# Patient Record
Sex: Female | Born: 1945 | Race: White | Hispanic: No | Marital: Married
Health system: Southern US, Community
[De-identification: ages and names within clinical notes are randomized; demographics above are authoritative.]

---

## 2004-09-15 ENCOUNTER — Ambulatory Visit: Payer: Self-pay | Admitting: Unknown Physician Specialty

## 2004-09-28 ENCOUNTER — Ambulatory Visit: Payer: Self-pay | Admitting: Unknown Physician Specialty

## 2005-05-05 ENCOUNTER — Ambulatory Visit: Payer: Self-pay | Admitting: Surgery

## 2005-09-21 ENCOUNTER — Ambulatory Visit: Payer: Self-pay | Admitting: Internal Medicine

## 2005-09-27 ENCOUNTER — Ambulatory Visit: Payer: Self-pay | Admitting: Urology

## 2005-12-15 ENCOUNTER — Ambulatory Visit: Payer: Self-pay | Admitting: Urology

## 2006-02-01 ENCOUNTER — Ambulatory Visit: Payer: Self-pay | Admitting: Internal Medicine

## 2006-02-16 ENCOUNTER — Ambulatory Visit (HOSPITAL_COMMUNITY): Admission: RE | Admit: 2006-02-16 | Discharge: 2006-02-17 | Payer: Self-pay | Admitting: Neurosurgery

## 2006-09-27 ENCOUNTER — Ambulatory Visit: Payer: Self-pay | Admitting: Internal Medicine

## 2012-05-22 LAB — URINALYSIS, COMPLETE
Bacteria: NONE SEEN
Ketone: NEGATIVE
Nitrite: NEGATIVE
RBC,UR: 6 /HPF (ref 0–5)
Squamous Epithelial: NONE SEEN
WBC UR: 2 /HPF (ref 0–5)

## 2012-05-22 LAB — COMPREHENSIVE METABOLIC PANEL
Alkaline Phosphatase: 106 U/L (ref 50–136)
BUN: 37 mg/dL — ABNORMAL HIGH (ref 7–18)
Bilirubin,Total: 0.9 mg/dL (ref 0.2–1.0)
Chloride: 100 mmol/L (ref 98–107)
EGFR (African American): 29 — ABNORMAL LOW
EGFR (Non-African Amer.): 25 — ABNORMAL LOW
Osmolality: 290 (ref 275–301)
Potassium: 2.9 mmol/L — ABNORMAL LOW (ref 3.5–5.1)

## 2012-05-22 LAB — SALICYLATE LEVEL: Salicylates, Serum: <1.7 mg/dL

## 2012-05-22 LAB — CBC
HCT: 45.2 % (ref 35.0–47.0)
HGB: 15.3 g/dL (ref 12.0–16.0)
MCHC: 33.8 g/dL (ref 32.0–36.0)
Platelet: 303 10*3/uL (ref 150–440)

## 2012-05-22 LAB — ETHANOL
Ethanol %: <0.003 % (ref 0.000–0.080)
Ethanol: <3 mg/dL

## 2012-05-22 LAB — DRUG SCREEN, URINE
Barbiturates, Ur Screen: NEGATIVE (ref ?–200)
Methadone, Ur Screen: NEGATIVE (ref ?–300)
Opiate, Ur Screen: POSITIVE (ref ?–300)
Tricyclic, Ur Screen: POSITIVE (ref ?–1000)

## 2012-05-22 LAB — ACETAMINOPHEN LEVEL: Acetaminophen: 2 ug/mL — ABNORMAL LOW

## 2012-05-22 LAB — TROPONIN I: Troponin-I: <0.02 ng/mL

## 2012-05-23 ENCOUNTER — Observation Stay: Payer: Self-pay | Admitting: Internal Medicine

## 2012-05-23 LAB — CBC WITH DIFFERENTIAL/PLATELET
Basophil #: 0 10*3/uL (ref 0.0–0.1)
Basophil %: 0.1 %
Lymphocyte #: 1.2 10*3/uL (ref 1.0–3.6)
Lymphocyte %: 8.6 %
MCH: 31.2 pg (ref 26.0–34.0)
Neutrophil #: 12 10*3/uL — ABNORMAL HIGH (ref 1.4–6.5)
Neutrophil %: 85.7 %
Platelet: 258 10*3/uL (ref 150–440)

## 2012-05-23 LAB — BASIC METABOLIC PANEL
Anion Gap: 5 — ABNORMAL LOW (ref 7–16)
Chloride: 98 mmol/L (ref 98–107)
Co2: 31 mmol/L (ref 21–32)
Creatinine: 1.36 mg/dL — ABNORMAL HIGH (ref 0.60–1.30)
EGFR (Non-African Amer.): 40 — ABNORMAL LOW
Glucose: 353 mg/dL — ABNORMAL HIGH (ref 65–99)
Osmolality: 290 (ref 275–301)
Potassium: 3.8 mmol/L (ref 3.5–5.1)
Sodium: 134 mmol/L — ABNORMAL LOW (ref 136–145)

## 2012-05-24 ENCOUNTER — Inpatient Hospital Stay: Payer: Self-pay | Admitting: Psychiatry

## 2012-05-24 LAB — BASIC METABOLIC PANEL
Chloride: 100 mmol/L (ref 98–107)
EGFR (African American): >60
Glucose: 143 mg/dL — ABNORMAL HIGH (ref 65–99)
Potassium: 3.1 mmol/L — ABNORMAL LOW (ref 3.5–5.1)

## 2012-05-25 LAB — BEHAVIORAL MEDICINE 1 PANEL
Anion Gap: 5 — ABNORMAL LOW (ref 7–16)
Basophil #: 0.1 10*3/uL (ref 0.0–0.1)
Basophil %: 0.5 %
Chloride: 103 mmol/L (ref 98–107)
Co2: 29 mmol/L (ref 21–32)
Creatinine: 0.77 mg/dL (ref 0.60–1.30)
EGFR (African American): >60
EGFR (Non-African Amer.): >60
Eosinophil #: 0 10*3/uL (ref 0.0–0.7)
Eosinophil %: 0.2 %
Glucose: 114 mg/dL — ABNORMAL HIGH (ref 65–99)
HCT: 33.5 % — ABNORMAL LOW (ref 35.0–47.0)
MCHC: 35.5 g/dL (ref 32.0–36.0)
Monocyte #: 0.8 x10 3/mm (ref 0.2–0.9)
Neutrophil %: 82 %
Potassium: 2.9 mmol/L — ABNORMAL LOW (ref 3.5–5.1)
RBC: 3.82 10*6/uL (ref 3.80–5.20)
SGPT (ALT): 44 U/L (ref 12–78)
Thyroid Stimulating Horm: 2.96 u[IU]/mL
Total Protein: 6.3 g/dL — ABNORMAL LOW (ref 6.4–8.2)
WBC: 11 10*3/uL (ref 3.6–11.0)

## 2012-05-25 LAB — URINALYSIS, COMPLETE
Glucose,UR: NEGATIVE mg/dL (ref 0–75)
Leukocyte Esterase: NEGATIVE
Nitrite: NEGATIVE
Ph: 8 (ref 4.5–8.0)
Protein: 30
RBC,UR: 18 /HPF (ref 0–5)
WBC UR: 1 /HPF (ref 0–5)

## 2012-05-26 LAB — BASIC METABOLIC PANEL
BUN: 15 mg/dL (ref 7–18)
Calcium, Total: 8.3 mg/dL — ABNORMAL LOW (ref 8.5–10.1)
Chloride: 102 mmol/L (ref 98–107)
Co2: 28 mmol/L (ref 21–32)
Creatinine: 0.88 mg/dL (ref 0.60–1.30)
Potassium: 3.1 mmol/L — ABNORMAL LOW (ref 3.5–5.1)
Sodium: 137 mmol/L (ref 136–145)

## 2012-05-27 LAB — BASIC METABOLIC PANEL
Anion Gap: 8 (ref 7–16)
Chloride: 101 mmol/L (ref 98–107)
Co2: 27 mmol/L (ref 21–32)
EGFR (African American): >60
Glucose: 120 mg/dL — ABNORMAL HIGH (ref 65–99)
Potassium: 3 mmol/L — ABNORMAL LOW (ref 3.5–5.1)
Sodium: 136 mmol/L (ref 136–145)

## 2012-05-29 LAB — BASIC METABOLIC PANEL
Anion Gap: 8 (ref 7–16)
BUN: 14 mg/dL (ref 7–18)
Calcium, Total: 8.3 mg/dL — ABNORMAL LOW (ref 8.5–10.1)
Chloride: 106 mmol/L (ref 98–107)
EGFR (African American): >60
Glucose: 93 mg/dL (ref 65–99)
Osmolality: 280 (ref 275–301)
Sodium: 140 mmol/L (ref 136–145)

## 2013-09-07 IMAGING — CT CT HEAD WITHOUT CONTRAST
1 of 2 series · 16 of 30 positions shown, 20 images · non-contrast
Comparison: none

REASON FOR EXAM: ams
COMMENTS:

PROCEDURE:     CT  - CT HEAD WITHOUT CONTRAST  - May 22, 2012  [DATE]
RESULT:     Comparison:  None
TECHNIQUE: Multiple axial images from the foramen magnum to the vertex were
obtained without IV contrast.

[Series 2: soft tissue · axial · 0.41mm/px · z∈[+417,+552]mm · 16 of 31 slices shown, 20 images]
[im 2/31  brain]
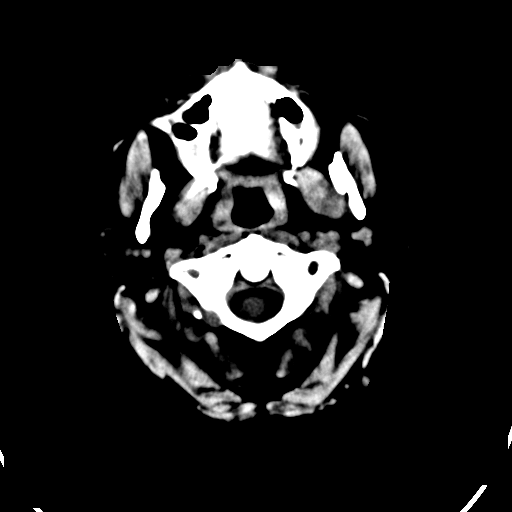
[im 2/31  bone]
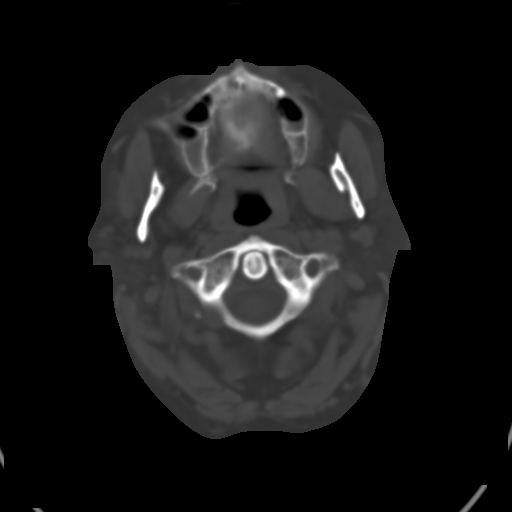
[im 3/31  brain]
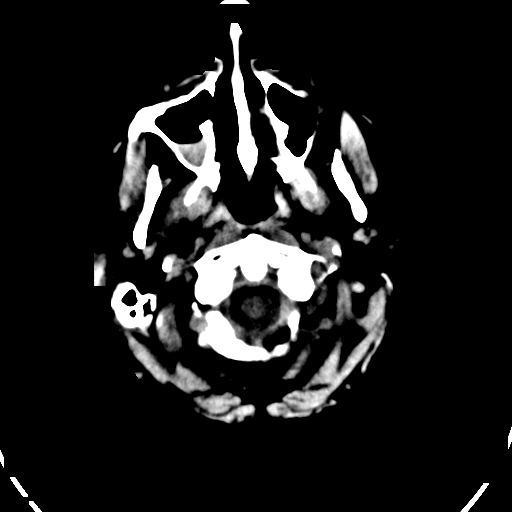
[im 6/31  brain]
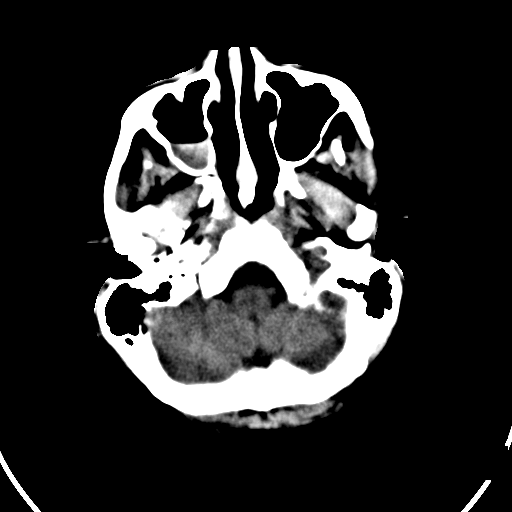
[im 8/31  brain]
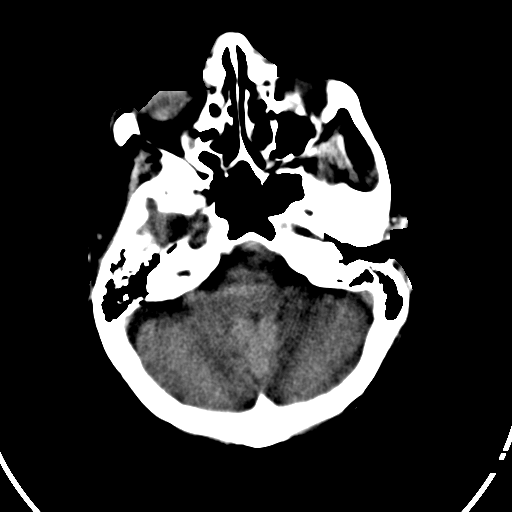
[im 9/31  brain]
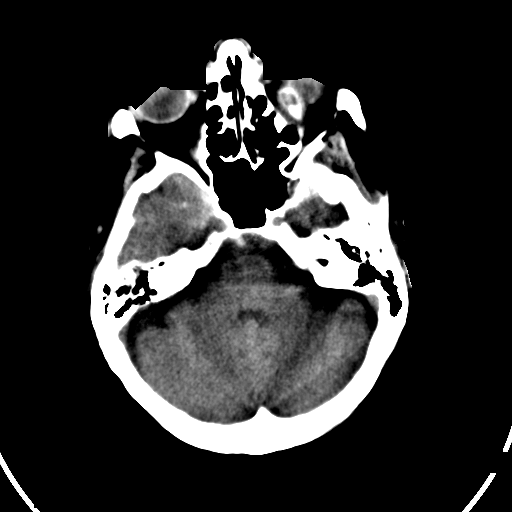
[im 9/31  bone]
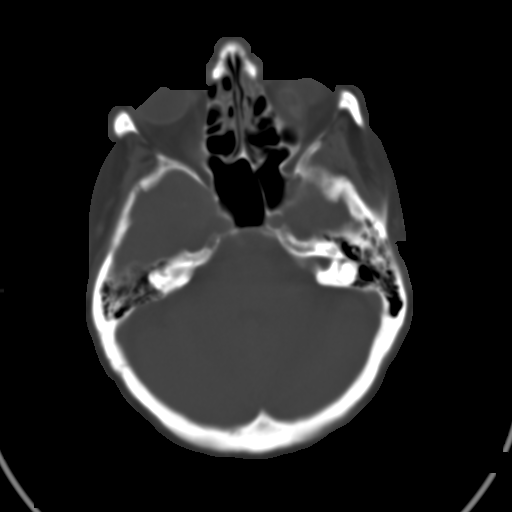
[im 11/31  brain]
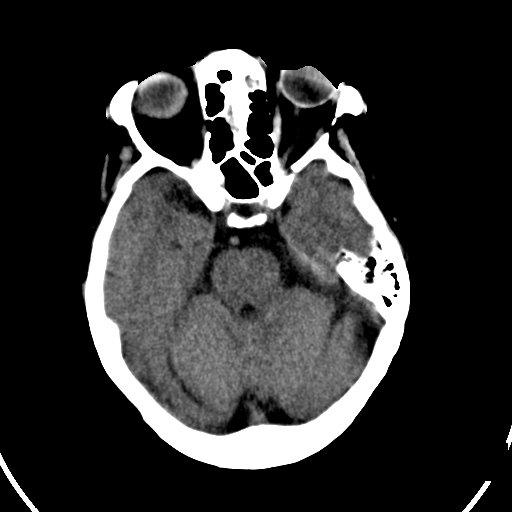
[im 13/31  brain]
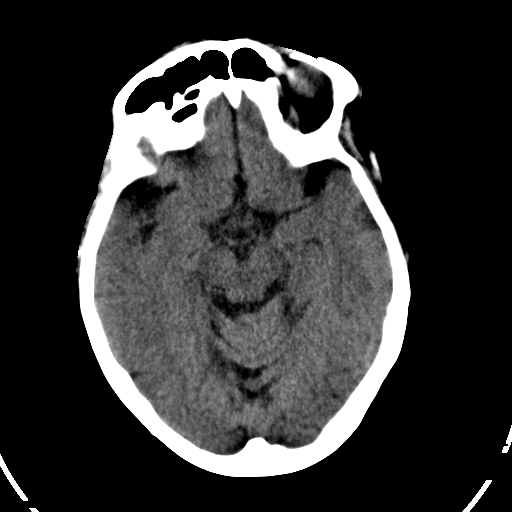
[im 15/31  brain]
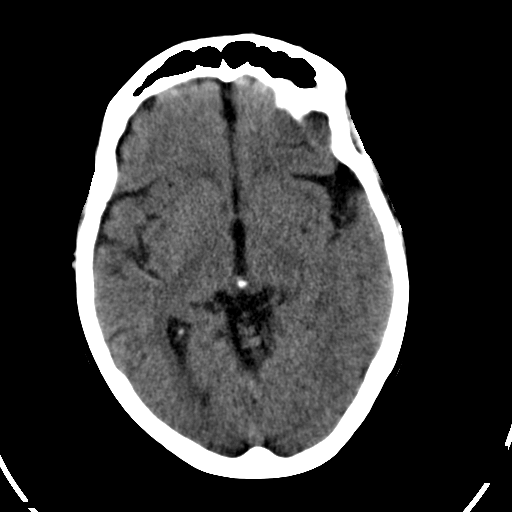
[im 16/31  brain]
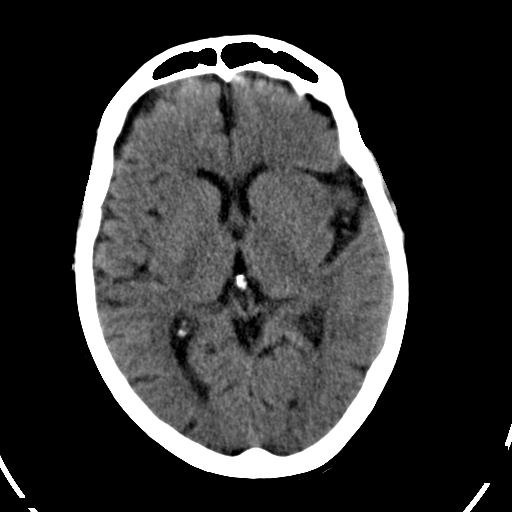
[im 16/31  bone]
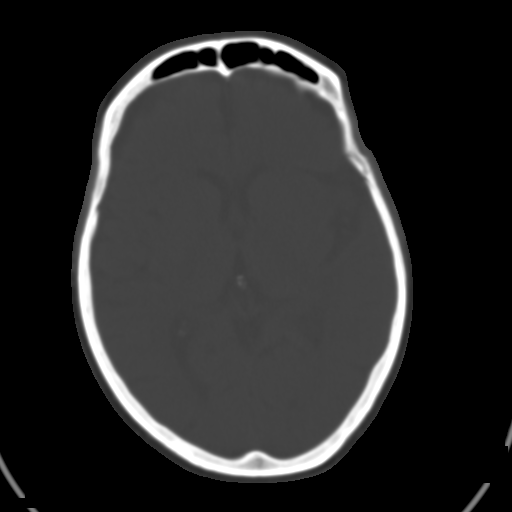
[im 18/31  brain]
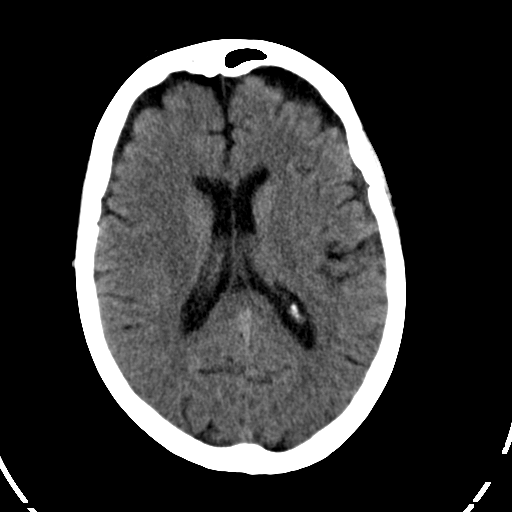
[im 21/31  brain]
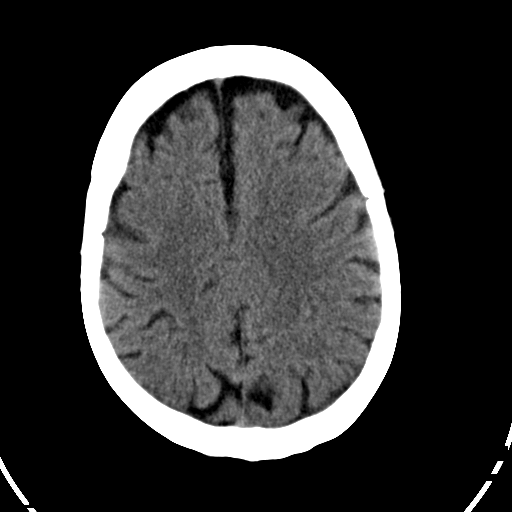
[im 22/31  brain]
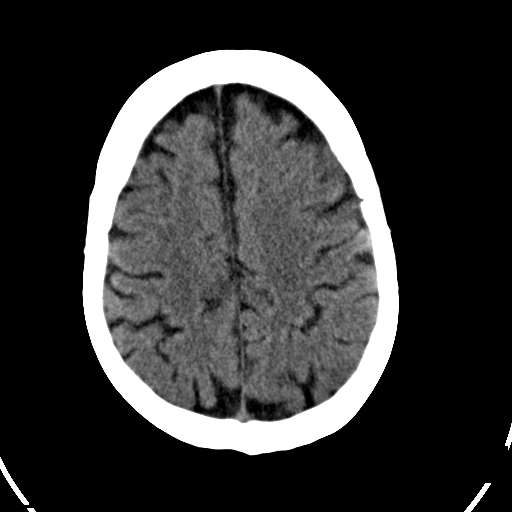
[im 23/31  brain]
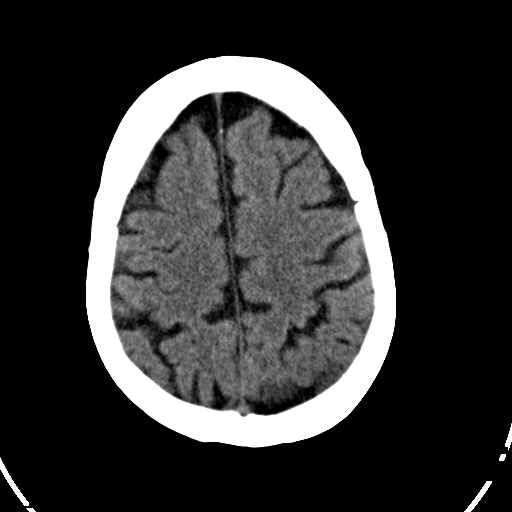
[im 23/31  bone]
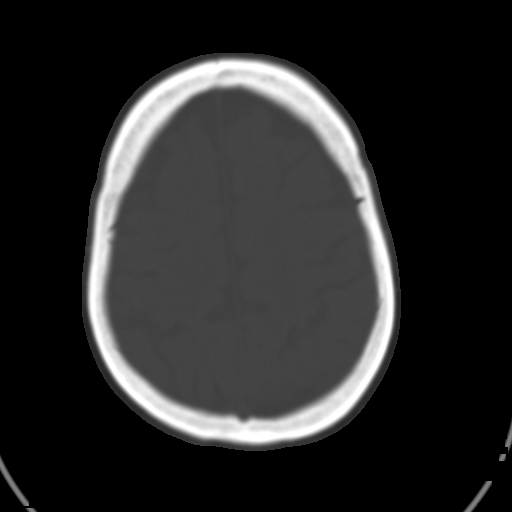
[im 25/31  brain]
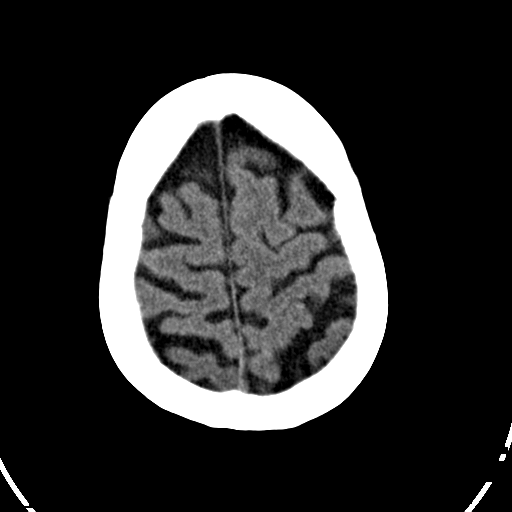
[im 28/31  brain]
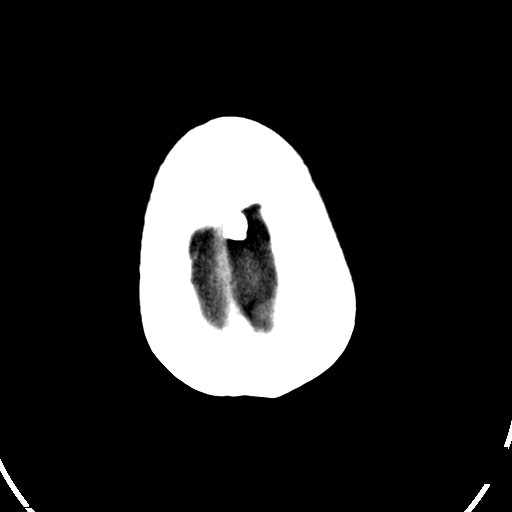
[im 29/31  brain]
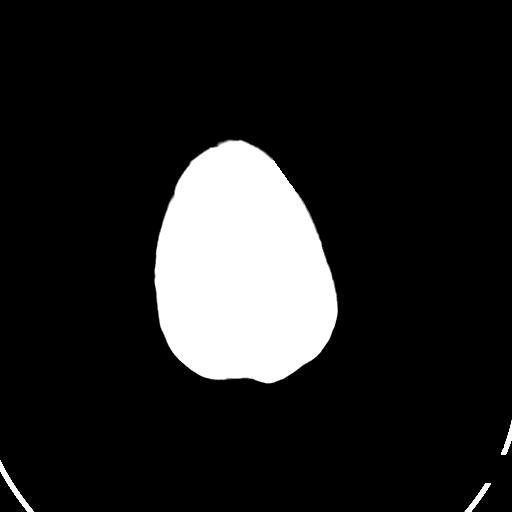

[16 of 30 positions shown; findings below may reference images not displayed]

FINDINGS: There is no evidence of mass effect, midline shift, or extra-axial fluid
collections.  There is no evidence of a space-occupying lesion or
intracranial hemorrhage. There is no evidence of a cortical-based area of
acute infarction. There is generalized cerebral atrophy. There is
periventricular white matter low attenuation likely secondary to
microangiopathy.

The ventricles and sulci are appropriate for the patient's age. The basal
cisterns are patent.

Visualized portions of the orbits are unremarkable. There is a right
maxillary sinus air-fluid level.

The osseous structures are unremarkable.
IMPRESSION: No acute intracranial process.

[REDACTED]

## 2014-02-07 DIAGNOSIS — Z961 Presence of intraocular lens: Secondary | ICD-10-CM | POA: Diagnosis not present

## 2014-02-07 DIAGNOSIS — H4052X1 Glaucoma secondary to other eye disorders, left eye, mild stage: Secondary | ICD-10-CM | POA: Diagnosis not present

## 2014-02-07 DIAGNOSIS — Z9842 Cataract extraction status, left eye: Secondary | ICD-10-CM | POA: Diagnosis not present

## 2014-02-07 DIAGNOSIS — H3581 Retinal edema: Secondary | ICD-10-CM | POA: Diagnosis not present

## 2014-05-03 NOTE — Consult Note (Signed)
Brief Consult Note: Diagnosis: major depression.   Patient was seen by consultant.   Recommend further assessment or treatment.   Discussed with Attending MD.   Comments: Psychiatry: Patient seen. Admits to symptoms of major depression. Under IVC. Will transfer to [facility]. Will notify intake nurse.  Electronic Signatures: Clapacs, Madie Reno (MD)  (Signed 14-May-14 15:33)  Authored: Brief Consult Note   Last Updated: 14-May-14 15:33 by Gonzella Lex (MD)

## 2014-05-03 NOTE — H&P (Signed)
PATIENT NAME:  Becky Rodriguez, Becky Rodriguez MR#:  [id number] DATE OF BIRTH:  11-10-1945  DATE OF ADMISSION:  05/24/2012  IDENTIFYING INFORMATION AND CHIEF COMPLAINT:  This is a 69 year old woman who was admitted through the Emergency Room for having taken an overdose of Xanax and Percocet.   CHIEF COMPLAINT:  "It's just those kids of mine."   HISTORY OF PRESENT ILLNESS:  The patient was admitted on May 12 through the Emergency Room where she presented after being found with altered mental status by her husband.  The patient reported that she had taken an overdose of Xanax and Percocet.  Initially she had stated that she was taking the Xanax to sleep, but on closer questioning admitted that she had taken 4 or 5 of each pill and that she had wanted to go to sleep and not wake up.  She said that it was impulsive, but that she has been having increasing thoughts about suicide recently.  Her mood has been depressed and down for many months, maybe over a year.  She feels tired most of the time.  She does not enjoy her usual activities.  Her husband says that she no longer gardens or gets out of the house.  She has had poor and interrupted sleep at night, especially when she is not taking medication for sleep.  She denies any hallucinations or psychotic symptoms.  She tends to blame her bad mood on the fact that her adult children ask her for money and take advantage of her.  Both she and her husband complain of this, but she complains of quite a bitterly.  She says that two of her adult children are constantly coming around looking for handouts and she thinks that they are irresponsible and she gets angry at them, but has not been able to say no to them.   PAST PSYCHIATRIC HISTORY:  No history of psychiatric hospitalizations.  She has seen her primary care doctor in [facility] who has prescribed citalopram 20 mg a day and BuSpar, unknown dose, possibly 10 mg twice a day as well as amitriptyline 25 mg at night.  The patient  said that she had been taking these medicines, but when I called her pharmacy they were last filled in February so she has not been compliant regularly with them.  She denies any history of suicide attempts.  Denies any history of psychotic symptoms.  Denies any history of mania.   SUBSTANCE ABUSE HISTORY:  The patient says she drinks very rarely and denies any history of alcohol or drug abuse.   SOCIAL HISTORY:  The patient is retired.  Lives with her husband.  Between the two of them they have four adult children, two of whom are her biological children.  There are at least two of them that she has a lot of irritation towards.   PAST MEDICAL HISTORY:  The patient does not have much insight about her medical history.  She says that she does not have any significant ongoing medical problems.  On the other hand, she has been prescribed chlorthalidone by her doctor in [place].  It is not clear why she has been on this, whether it is for her blood pressure or what, but it may be the reason why she was running a low potassium here in the hospital.  Denies history of strokes.  Denies a history of heart attacks.   CURRENT MEDICATIONS:  Apparently, chlorthalidone 1 mg once or twice a day, BuSpar unknown dose, citalopram  20 mg a day, amitriptyline 25 to 50 at night, unclear how compliant she has been with any of this.  Neither the Xanax nor the Percocet are her only medicine.   ALLERGIES:  No known drug allergies.   REVIEW OF SYSTEMS:  Positive for depression, low energy, lack of interest in normal activities.  Has had passive suicidal thoughts, then had an active suicidal thought.  Denies hallucinations or delusions.  Reports weakness.  Denies chest pain.  Denies shortness of breath or other physical symptoms.   MENTAL STATUS EXAMINATION:  The patient was cooperative with the interview.  Eye contact was good.  Psychomotor activity appropriate.  Speech easy to understand.  Affect irritated and blunted.   Mood stated as being depressed.  Thoughts appear to be lucid without any clear loosening of associations or delusions.  Denies auditory or visual hallucinations.  Denies any current suicidal ideation or homicidal ideation.  Is alert and oriented x 4.   PHYSICAL EXAMINATION: GENERAL:  Normally built woman, looks her stated age.  Cooperative with the exam.  HEENT:  Pupils are equal and reactive.  (Dictation Anomaly) Face is symetric.  She has a notable rash and roughening on both of her cheeks, although she does not report being aware of it.  No other skin lesions identified.  Face is symmetric.  MUSCULOSKELETAL:  Full range of motion at neck.  Full range of motion at all extremities.  Normal gait.  Strength and reflexes normal and symmetric throughout.  Cranial nerves symmetric.  LUNGS:  Clear without wheezes.  HEART:  Regular rate and rhythm.  ABDOMEN:  Soft, nontender, normal bowel sounds.   LABORATORY RESULTS:  Chemistry panel showed an elevated glucose on a nonfasting draw at 143.  Potassium on admission was very low at 2.9, had come up to 3.1 by the time of release.  CBC slightly high at 11.4.  Drug screen positive for tricyclics, probably the amitriptyline if she takes that.  Also positive for opiates and benzodiazepines from the overdose.  Borderline urinalysis.  Head CT unremarkable.   ASSESSMENT:  This is a 69 year old woman with multiple symptoms consistent with her recent major depression.  Took an intentional overdose of medicine.  She admits that there was some suicidal ideation to it.  She is not argumentative about coming to psychiatry.  She has been seeing her primary care doctor, but not following up regularly with her medicine.  Needs hospitalization for stabilization and treatment.   TREATMENT PLAN:  The patient was put under involuntary commitment in the Emergency Room.  She is however cooperative.  She will be transferred to behavioral health.  Continue current medications including  the potassium supplements started on medicine.  Also continue the citalopram.  Engage her in groups and activities on the unit.  Work on assessing degree of depression.  Work on referral for outpatient treatment ultimately.   DIAGNOSIS, PRINCIPAL AND PRIMARY:  AXIS I:  Major depression, single, severe.   SECONDARY DIAGNOSES: AXIS I:  No further.  AXIS II:  Deferred.  AXIS III:  Hypokalemia, possibly drug-induced, status post overdose, recovering.  AXIS IV:  Moderate from the stress she describes at home.  AXIS V:  Functioning at time of evaluation 60.     ____________________________ Gonzella Lex, MD jtc:ea D: 05/24/2012 23:00:32 ET T: 05/24/2012 23:23:00 ET JOB#: [id number]  cc: Gonzella Lex, MD, <Dictator> Gonzella Lex MD ELECTRONICALLY SIGNED 05/25/2012 14:24

## 2014-05-03 NOTE — Discharge Summary (Signed)
PATIENT NAME:  Becky Rodriguez, Becky Rodriguez MR#:  [id number] DATE OF BIRTH:  1945-01-21  DATE OF ADMISSION:  05/24/2012 DATE OF DISCHARGE:  05/29/2012  HOSPITAL COURSE: See dictated history and physical for details of admission. This 69 year old woman was transferred from the medicine service, where she had been admitted for what appeared to be an intentional overdose of Percocet and benzodiazepines. On the psychiatry service, the patient was monitored for, but did not show any signs of withdrawal from drugs. She was engaged in groups and activities for monitoring of suicidal ideation and treatment of mood symptoms. The patient showed improvement in her insight and her mood symptoms throughout her hospital stay. She was tolerant of continuing BuSpar and low-dose Remeron. Citalopram had been continued initially, but the patient continued to have symptoms of diarrhea multiple times a day, which was probably contributing to her low potassium. Citalopram was discontinued, and the diarrhea seems to have lessened somewhat. At this point, I have not replaced it with another new antidepressant. Her mood and affect appear to be improved, and she is showing better insight. She has been referred for outpatient treatment at [facility] for followup evaluation of substance use and mood disorder. She has been given individual and group therapy focusing on improving her self-confidence and ability to set boundaries at home.   DISCHARGE MEDICATION: Mirtazapine 15 mg at night, BuSpar 10 mg 3 times a day, potassium chloride 20 mEq twice a day.   LABORATORY RESULTS: Most recent chemistry panel shows potassium up to 4.1, everything else normal. At the earlier time in her hospital stay, she had an initial drug screen positive for opiates and benzodiazepines. Urinalysis not infected. Head CT normal. CBC with a slightly elevated white count. Chemistry panel showed a creatinine elevated at 1.36, sodium low at 134, potassium initially 3.8, but then  went down first to 3.1 and later to 3. Low was 2.9. Potassium has been supplemented as noted above.   MENTAL STATUS EXAMINATION AT DISCHARGE: A neatly dressed and groomed woman, looks her stated age. Cooperative with the interview. Good eye contact, normal psychomotor activity. Speech normal in rate, tone and volume. Affect euthymic, reactive and appropriate. Mood stated as good. Thoughts lucid. No loosening of associations or delusions. Denies auditory or visual hallucinations. Denies suicidal or homicidal ideation. Shows improved judgment and insight. Intelligence normal. Alert and oriented x4.   DISPOSITION: Discharge home to her husband. Follow up with [facility].   DIAGNOSIS, PRINCIPAL AND PRIMARY:  AXIS I: Depression, not otherwise specified.   SECONDARY DIAGNOSES:  AXIS I:  1. Opiate abuse versus dependence.  2. 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  AXIS II: Deferred.  AXIS III: Hypokalemia of unclear etiology.  AXIS IV: Severe from financial problems and conflict with her children.  AXIS V: Functioning at time of discharge 42.    ____________________________ Gonzella Lex, MD jtc:OSi D: 05/30/2012 00:06:00 ET T: 05/30/2012 07:35:37 ET JOB#: [id number]  cc: Gonzella Lex, MD, <Dictator> Gonzella Lex MD ELECTRONICALLY SIGNED 05/30/2012 9:37

## 2014-05-03 NOTE — Consult Note (Signed)
PATIENT NAME:  Becky Rodriguez, Becky Rodriguez MR#:  [id number] DATE OF BIRTH:  05-10-1945  DATE OF CONSULTATION:  05/23/2012  REFERRING PHYSICIAN:  Nicholes Mango, MD CONSULTING PHYSICIAN:  Cordelia Pen. Gretel Acre, MD  REASON FOR CONSULTATION: Drug overdose, anxiety.   HISTORY OF PRESENT ILLNESS: The patient is a 69 year old Caucasian married female who was admitted from the Emergency Room after she was found unresponsive by her husband. The patient was trying to fall asleep, but then she tried to take her husband's Xanax and gradually took 4 pills as well as 4 pills of Percocet. Eventually the patient was found to be cyanotic, as she took an overdose of the medications. EMS was called by her husband, who gave her Narcan. Following this, the patient became responsive. The patient was still very lethargic as she was brought into the ER. CT of the head was negative. The patient was given IV fluids and she started having shallow breathing. The patient was admitted by the hospitalist team. During my evaluation, the patient was noted to be calm. She reported that she was getting the pills from "her friends." She reported that she was upset with her children and she was having a lot of stressors. She does not want to get into details. She reported that she takes medications including Klonopin, one white pill, and then some chlorthalidone bottles were also found empty besides her. She  does not want to give out the details about the medications and where they were coming from. Reported that she has several stressors with her children and she was trying to minimize the events which led to her overdose on the medications. Reported that she will go back to her primary care physician, Dr. Marnee Guarneri in [facility], and they will start prescribing her medications for her anxiety.   Her husband, who was also present during the interview, reported that he was concerned about her use of the medications which she was getting from her friends. The patient  stated that she has a long history of anxiety and depression but she has never received any treatment, but now she thinks that she might need some treatment for the same.   PAST PSYCHIATRIC HISTORY: The patient reported that she has never been prescribed the medications, but she has always used them from the friends. She denied any previous history of suicide attempt.   MEDICAL HISTORY: Hypertension, obesity.   PAST SURGICAL HISTORY: Denied.   ALLERGIES: No known drug allergies.   CURRENT HOME MEDICATIONS: Denied any medication for hypertension, but has been taking Klonopin and BuSpar from the friends.   FAMILY HISTORY: Colon cancer in her family.   REVIEW OF SYSTEMS:  CONSTITUTIONAL: The patient denied any fever, fatigue or weakness.  EYES: Denied any blurry vision, inflammation or glaucoma.  EARS, NOSE, THROAT: Denied any epistaxis, discharge, postnasal drip or sinus pain. No difficulty in swallowing. RESPIRATORY: Denied any cough, chronic obstructive pulmonary disease or dyspnea.  CARDIOVASCULAR: No chest pain, palpitations, syncope.  GASTROINTESTINAL: No nausea, vomiting, diarrhea.  GENITOURINARY: No dysuria, hematuria, renal calculi, fever.  ENDOCRINE: No polyuria, nocturia, thyroid problems.  INTEGUMENTARY: No acne or rash.  MUSCULOSKELETAL: No joint pain or neck pain. Denies any gout.   VITAL SIGNS: Temperature 99.8, pulse 109, respirations 20, blood pressure 117/57.   LABORATORY DATA: Glucose 353, BUN 34, creatinine 1.36, sodium 134, potassium 3.8, chloride 98, bicarbonate 31, GFR 47, anion gap 5, osmolality 290, calcium 7.6, magnesium 1.6. Urine drug screen positive for benzodiazepines, opioids and tricyclic antidepressants. WBC  14, hemoglobin 12.9, hematocrit 36.6, MCV 89.   MENTAL STATUS EXAMINATION: The patient is a moderately built female who was lying in the bed. She appeared anxious. Her eye contact was good. Affect was blunted. TP- was tangential.TC- non delusional. She  was unable to contract for safety as she just OD on medications.  She was minimizing the events which led to her admission. She was not forthcoming with the information.   Diagnosis:  MOOD Do NOS  Plan: Discussed with her husband and the patient about the need for the patient being admitted to the inpatient behavioral health unit, and they were minimizing the events. I will initiate involuntary commitment for the safety of  the patient at this time.  Once she is clinically stable, she will the transferred to the behavioral health unit. She will not be given any medication at this time and her medication will be adjusted when she is clinically stable and transferred to [facility].   Thank you for allowing me to participate in the care of this patient.   ____________________________ Cordelia Pen. Gretel Acre, MD usf:jm D: 05/23/2012 17:35:54 ET T: 05/23/2012 18:30:37 ET JOB#: [id number]  cc: Cordelia Pen. Gretel Acre, MD, <Dictator> Jeronimo Norma MD ELECTRONICALLY SIGNED 05/25/2012 13:20

## 2014-05-03 NOTE — Discharge Summary (Signed)
PATIENT NAME:  Becky Rodriguez, Becky Rodriguez MR#:  [id number] DATE OF BIRTH:  [birth year]  DATE OF ADMISSION:  05/23/2012 DATE OF DISCHARGE:  05/24/2012   For a detailed note, please take a look at the history and history and physical done on admission.   DIAGNOSES AT DISCHARGE: Drug overdose secondary to Xanax and Percocet. Altered mental status secondary to a drug overdose: Depression with suicide attempt. Acute renal failure. Hypokalemia. Anxiety and depression.   DISPOSITION: The patient is being discharged to inpatient behavioral medicine.   ACTIVITY: As tolerated.   FOLLOWUP: With her primary care physician on discharge. That needs to be arranged, as she does not have one.   DISCHARGE MEDICATIONS: Potassium 20 mEq b.i.d. x 5 days.   Clever COURSE: Dr. Weber Cooks from psychiatry and Dr. Gretel Acre also from psychiatry.   PERTINENT STUDIES DONE DURING THE HOSPITAL COURSE: CT scan of the head done without contrast on admission showing no acute intracranial process.   HOSPITAL COURSE: This is a 69 year old female with medical problems as mentioned above who presented to the hospital on 05/23/2012 secondary to altered mental status from a drug overdose.  1.  Altered mental status. This was likely secondary to drug overdose. The patient took a combination of Percocet and Xanax and presented altered. She was given some Narcan in the ER  and significantly improved. She was admitted and observed overnight. A psychiatric consult was obtained. The patient apparently is not on those medications and took those medications from her husband. She showed signs of suicidal ideations and depression and therefore was involuntarily committed. The patient's mental status is back to baseline. Therefore, she is being discharged to inpatient behavioral medicine for further management of her depression.  2.  Acute renal failure. This was likely secondary to dehydration and poor p.o. intake. This resolved with  aggressive IV fluid hydration and her creatinine is now back down to baseline.  3.  Hypokalemia, also secondary to poor p.o. intake. Her potassium has improved with supplementation, but I am discharging her on 5 more days of p.o. potassium as mentioned.  4.  Depression. The patient currently denies suicidal ideations, although has been seen by psychiatry and has been involuntarily committed, and they are accepting her downstairs for further management of her depression.   The patient is stable from the medical standpoint to be discharged to behavioral medicine. This was discussed with Dr. Weber Cooks.   TIME SPENT ON DISCHARGE: 40 minutes.   ____________________________ Belia Heman. Verdell Carmine, MD vjs:jm D: 05/24/2012 15:56:21 ET T: 05/24/2012 17:27:29 ET JOB#: [id number]  cc: Belia Heman. Verdell Carmine, MD, <Dictator> Henreitta Leber MD ELECTRONICALLY SIGNED 05/30/2012 14:48

## 2014-05-03 NOTE — H&P (Signed)
PATIENT NAME:  Becky Rodriguez, Becky Rodriguez MR#:  [id number] DATE OF BIRTH:  July 17, 1945  DATE OF ADMISSION:  05/23/2012  PRIMARY CARE PHYSICIAN: Dr. Ramonita Lab   REFERRING PHYSICIAN: Dr. Owens Shark   CHIEF COMPLAINT: Drug overdose.   HISTORY OF PRESENT ILLNESS: The patient is a 69 year old Caucasian female with a past medical history of anxiety and hypertension that was sent over to the ER by her husband via EMS after she was found unresponsive. At around 4:00 p.m. today the patient was trying to fall asleep, but she could not. She tried her husband's Xanax 4 pills and Percocet 4 pills to get good sleep. Eventually the patient was found to be cyanotic and unresponsive by her husband, who called EMS immediately. EMS gave her Narcan one dose. Following this, patient became responsive. The patient was still lethargic and she was brought into the ER. CT scan of the head was negative. The patient was given IV fluids and she was having shallow breathing. She was hypoxemic and she was placed on 2 liters of oxygen. Hospitalist team is called to admit the patient for hypoxemia and overnight observation. During my examination, the patient is arousable although she is lethargic. No family members are at bedside. Answered a few questions but falling asleep. She says she is thirsty, but denies any other complaints. She admits that she takes Klonopin 5 mg on a daily basis for anxiety. The patient's medications BuSpar and chlorthalidone bottles were also found empty at the bedside. The patient answers most of the questions but lethargic and falling asleep.   PAST MEDICAL HISTORY: Long history of anxiety and hypertension.   PAST SURGICAL HISTORY: None.   ALLERGIES: No known drug allergies.   HOME MEDICATIONS:  1.  Klonopin.  2.  Denies any medications for hypertension.  3.  BuSpar    PSYCHOSOCIAL HISTORY: Lives with her husband. No history of smoking, alcohol or illicit drug use.   FAMILY HISTORY: Mother and dad both had colon  cancer.   REVIEW OF SYSTEMS:  CONSTITUTIONAL: Denies any fever, fatigue, weakness.  EYES: No blurry vision, inflammation, glaucoma.  EARS, NOSE, THROAT: No epistaxis, discharge, postnasal drip or sinus pain. No difficulty in swallowing.  RESPIRATION: No cough, chronic obstructive pulmonary disease or dyspnea.  CARDIOVASCULAR: No chest pain, palpitations, syncope.  GASTROINTESTINAL: No nausea, vomiting, diarrhea, abdominal pain. Denies any GERD.  GENITOURINARY: No dysuria, hematuria, renal calculi, fever.  GYNECOLOGY AND BREASTS: No breast masses or vaginal discharge.  ENDOCRINE: No polyuria, nocturia, thyroid problems.  LYMPHATIC: No anemia, easy bruising or bleeding.  INTEGUMENTARY: No acne, rash, lesions.  MUSCULOSKELETAL: No joint pain in the neck, back, shoulder. Denies any gout.  NEUROLOGIC: Denies any ataxia, vertigo, dementia.  PSYCHIATRIC: Has chronic history of anxiety. Denies any ADD, OCD, bipolar disorder.   PHYSICAL EXAMINATION: VITAL SIGNS: Temperature 99.8, pulse 108, respirations 20, blood pressure 117/57, pulse oximetry 96% on 3 liters.  GENERAL APPEARANCE: Not in acute distress. Lethargic but arousable. Moderately built and moderately nourished.  HEENT: Normocephalic, atraumatic. Pupils are equally reacting to light and accommodation. No scleral icterus. No conjunctival injection. Extraocular movements are intact. No sinus tenderness. No postnasal drip. No pharyngeal exudates.  NECK: Supple. No JVD. No thyromegaly. No lymphadenopathy.  LUNGS: Clear to auscultation bilaterally. No accessory muscle usage. No anterior chest wall tenderness on palpation.  CARDIAC: S1, S2 normal. Regular rate and rhythm. No murmurs. No edema.  GASTROINTESTINAL: Soft. Bowel sounds are positive in all four quadrants. Nontender, nondistended. No masses felt. No hepatosplenomegaly.  NEUROLOGIC: Lethargic but arousable. Oriented x3 when she is aroused. Cranial nerves II through XII are intact. Motor  and sensory grossly intact. Reflexes are 2+.  EXTREMITIES: No edema. No cyanosis. No clubbing.  SKIN: Warm to touch. Normal turgor. No rashes, no lesions.  MUSCULOSKELETAL: No joint effusion, tenderness or erythema.   LABORATORY AND IMAGING STUDIES: Glucose 172, BUN 37, creatinine 2.03, sodium 139, potassium 2.9, chloride 100, CO2 28. GFR 29, anion gap 11, serum osmolality 290. Troponin less than 0.02. TSH is 1.13. Urine drug screen is positive for benzos and tricyclic antidepressant.   URINALYSIS: Yellow in color, clear in appearance. Bilirubin, ketones negative. Specific gravity 1.020. Leukocyte esterase and nitrites are negative. Serum Tylenol level is 2, salicylate level less than 1.7. ABG: pH 7.38, pCO2 44, pO2 75,bicarbonate is 26. WBC 11.4, hemoglobin 15.3, hematocrit 45.2, platelets 303.   CT of the head without contrast has revealed no acute intracranial process.   12-LEAD EKG: Sinus tachy at 106 beats per minute. Normal PR and QRS intervals. Nonspecific ST-T wave abnormalities noticed.   ASSESSMENT AND PLAN: 1. Hypoxemia secondary to lethargy and probably from shallow breathing from drug overdose. We will admit her to observation status for close observation, telemetry for close monitoring.  2. Neuro checks.  3. Oxygen via nasal cannula to try to keep her pulse oximetry at 92% to 93%. Psych consult is placed. IV fluids. We will watch for aspiration pneumonia.  4. Drug overdose. The patient is reporting that it is unintentional. We will hold off on her home medication as the patient is lethargic at this time. Psych consult is placed.  5. Hypokalemia. Replace with potassium supplements and supplemented IV fluids.  6. Renal insufficiency, probably chronic in nature. Not sure whether it has an acute component. We will provide her IV fluids 1 liter and monitor renal function.  7. Chronic anxiety. Holding home medications as the patient is lethargic at this time.  8. Hypertension. The  patient reports that she is not on any medications. Will monitor blood pressure, and if needed will put her on antihypertensive.  9. Will provide gastrointestinal and deep vein thrombosis prophylaxis.  10. CODE STATUS: SHE IS FULL CODE.  11. Transfer the patient to [facility] in a.m.   No family members are available at bedside. Plan of care was discussed briefly with the patient. She is aware.   Total Time Spent is 50 minutes.    ____________________________ Nicholes Mango, MD ag:aw D: 05/22/2012 23:33:22 ET T: 05/23/2012 05:01:00 ET JOB#: [id number]  cc: Nicholes Mango, MD, <Dictator> Nicholes Mango MD ELECTRONICALLY SIGNED 05/31/2012 1:06

## 2014-06-03 DIAGNOSIS — F419 Anxiety disorder, unspecified: Secondary | ICD-10-CM | POA: Diagnosis not present

## 2014-06-03 DIAGNOSIS — F329 Major depressive disorder, single episode, unspecified: Secondary | ICD-10-CM | POA: Diagnosis not present

## 2014-06-03 DIAGNOSIS — F411 Generalized anxiety disorder: Secondary | ICD-10-CM | POA: Diagnosis not present

## 2014-06-03 DIAGNOSIS — I10 Essential (primary) hypertension: Secondary | ICD-10-CM | POA: Diagnosis not present

## 2014-06-03 DIAGNOSIS — Z79899 Other long term (current) drug therapy: Secondary | ICD-10-CM | POA: Diagnosis not present

## 2014-06-24 DIAGNOSIS — N63 Unspecified lump in breast: Secondary | ICD-10-CM | POA: Diagnosis not present

## 2014-06-24 DIAGNOSIS — Z1231 Encounter for screening mammogram for malignant neoplasm of breast: Secondary | ICD-10-CM | POA: Diagnosis not present

## 2014-07-09 DIAGNOSIS — R922 Inconclusive mammogram: Secondary | ICD-10-CM | POA: Diagnosis not present

## 2014-07-09 DIAGNOSIS — Z7982 Long term (current) use of aspirin: Secondary | ICD-10-CM | POA: Diagnosis not present

## 2014-07-09 DIAGNOSIS — N6489 Other specified disorders of breast: Secondary | ICD-10-CM | POA: Diagnosis not present

## 2014-07-09 DIAGNOSIS — R928 Other abnormal and inconclusive findings on diagnostic imaging of breast: Secondary | ICD-10-CM | POA: Diagnosis not present

## 2014-07-26 DIAGNOSIS — R920 Mammographic microcalcification found on diagnostic imaging of breast: Secondary | ICD-10-CM | POA: Diagnosis not present

## 2014-07-26 DIAGNOSIS — I10 Essential (primary) hypertension: Secondary | ICD-10-CM | POA: Diagnosis not present

## 2014-07-26 DIAGNOSIS — R921 Mammographic calcification found on diagnostic imaging of breast: Secondary | ICD-10-CM | POA: Diagnosis not present

## 2014-07-26 DIAGNOSIS — D0512 Intraductal carcinoma in situ of left breast: Secondary | ICD-10-CM | POA: Diagnosis not present

## 2014-07-26 DIAGNOSIS — F329 Major depressive disorder, single episode, unspecified: Secondary | ICD-10-CM | POA: Diagnosis not present

## 2014-07-29 DIAGNOSIS — I10 Essential (primary) hypertension: Secondary | ICD-10-CM | POA: Diagnosis not present

## 2014-07-29 DIAGNOSIS — F411 Generalized anxiety disorder: Secondary | ICD-10-CM | POA: Diagnosis not present

## 2014-07-29 DIAGNOSIS — F329 Major depressive disorder, single episode, unspecified: Secondary | ICD-10-CM | POA: Diagnosis not present

## 2014-08-07 DIAGNOSIS — N63 Unspecified lump in breast: Secondary | ICD-10-CM | POA: Diagnosis not present

## 2014-08-07 DIAGNOSIS — R928 Other abnormal and inconclusive findings on diagnostic imaging of breast: Secondary | ICD-10-CM | POA: Diagnosis not present

## 2014-08-07 DIAGNOSIS — C50512 Malignant neoplasm of lower-outer quadrant of left female breast: Secondary | ICD-10-CM | POA: Diagnosis not present

## 2014-08-07 DIAGNOSIS — D0512 Intraductal carcinoma in situ of left breast: Secondary | ICD-10-CM | POA: Diagnosis not present

## 2014-08-07 DIAGNOSIS — I10 Essential (primary) hypertension: Secondary | ICD-10-CM | POA: Diagnosis not present

## 2014-08-13 DIAGNOSIS — D0512 Intraductal carcinoma in situ of left breast: Secondary | ICD-10-CM | POA: Diagnosis not present

## 2014-08-13 DIAGNOSIS — F411 Generalized anxiety disorder: Secondary | ICD-10-CM | POA: Diagnosis not present

## 2014-08-13 DIAGNOSIS — I10 Essential (primary) hypertension: Secondary | ICD-10-CM | POA: Diagnosis not present

## 2014-08-15 DIAGNOSIS — F329 Major depressive disorder, single episode, unspecified: Secondary | ICD-10-CM | POA: Diagnosis not present

## 2014-08-15 DIAGNOSIS — I10 Essential (primary) hypertension: Secondary | ICD-10-CM | POA: Diagnosis not present

## 2014-08-15 DIAGNOSIS — D0512 Intraductal carcinoma in situ of left breast: Secondary | ICD-10-CM | POA: Diagnosis not present

## 2014-08-15 DIAGNOSIS — Z79899 Other long term (current) drug therapy: Secondary | ICD-10-CM | POA: Diagnosis not present

## 2014-08-15 DIAGNOSIS — C50912 Malignant neoplasm of unspecified site of left female breast: Secondary | ICD-10-CM | POA: Diagnosis not present

## 2014-08-26 DIAGNOSIS — D0512 Intraductal carcinoma in situ of left breast: Secondary | ICD-10-CM | POA: Diagnosis not present

## 2014-09-11 DIAGNOSIS — D0512 Intraductal carcinoma in situ of left breast: Secondary | ICD-10-CM | POA: Diagnosis not present

## 2014-09-11 DIAGNOSIS — Z9889 Other specified postprocedural states: Secondary | ICD-10-CM | POA: Diagnosis not present

## 2014-09-17 DIAGNOSIS — Z9842 Cataract extraction status, left eye: Secondary | ICD-10-CM | POA: Diagnosis not present

## 2014-09-17 DIAGNOSIS — Z9841 Cataract extraction status, right eye: Secondary | ICD-10-CM | POA: Diagnosis not present

## 2014-09-17 DIAGNOSIS — H3581 Retinal edema: Secondary | ICD-10-CM | POA: Diagnosis not present

## 2014-09-17 DIAGNOSIS — Z961 Presence of intraocular lens: Secondary | ICD-10-CM | POA: Diagnosis not present

## 2014-10-20 DIAGNOSIS — R109 Unspecified abdominal pain: Secondary | ICD-10-CM | POA: Diagnosis not present

## 2014-10-20 DIAGNOSIS — F419 Anxiety disorder, unspecified: Secondary | ICD-10-CM | POA: Diagnosis not present

## 2014-10-20 DIAGNOSIS — M47819 Spondylosis without myelopathy or radiculopathy, site unspecified: Secondary | ICD-10-CM | POA: Diagnosis not present

## 2014-10-20 DIAGNOSIS — D72829 Elevated white blood cell count, unspecified: Secondary | ICD-10-CM | POA: Diagnosis not present

## 2014-10-20 DIAGNOSIS — K449 Diaphragmatic hernia without obstruction or gangrene: Secondary | ICD-10-CM | POA: Diagnosis not present

## 2014-10-20 DIAGNOSIS — K529 Noninfective gastroenteritis and colitis, unspecified: Secondary | ICD-10-CM | POA: Diagnosis not present

## 2014-10-20 DIAGNOSIS — Z90710 Acquired absence of both cervix and uterus: Secondary | ICD-10-CM | POA: Diagnosis not present

## 2014-10-20 DIAGNOSIS — F329 Major depressive disorder, single episode, unspecified: Secondary | ICD-10-CM | POA: Diagnosis not present

## 2014-10-20 DIAGNOSIS — R112 Nausea with vomiting, unspecified: Secondary | ICD-10-CM | POA: Diagnosis not present

## 2014-10-20 DIAGNOSIS — R197 Diarrhea, unspecified: Secondary | ICD-10-CM | POA: Diagnosis not present

## 2014-10-20 DIAGNOSIS — R1032 Left lower quadrant pain: Secondary | ICD-10-CM | POA: Diagnosis not present

## 2014-10-20 DIAGNOSIS — I10 Essential (primary) hypertension: Secondary | ICD-10-CM | POA: Diagnosis not present

## 2014-10-20 DIAGNOSIS — K219 Gastro-esophageal reflux disease without esophagitis: Secondary | ICD-10-CM | POA: Diagnosis not present

## 2014-10-22 DIAGNOSIS — I10 Essential (primary) hypertension: Secondary | ICD-10-CM | POA: Diagnosis not present

## 2014-10-22 DIAGNOSIS — F329 Major depressive disorder, single episode, unspecified: Secondary | ICD-10-CM | POA: Diagnosis not present

## 2014-10-22 DIAGNOSIS — F419 Anxiety disorder, unspecified: Secondary | ICD-10-CM | POA: Diagnosis not present

## 2014-10-22 DIAGNOSIS — Z79899 Other long term (current) drug therapy: Secondary | ICD-10-CM | POA: Diagnosis not present

## 2014-10-22 DIAGNOSIS — Z72820 Sleep deprivation: Secondary | ICD-10-CM | POA: Diagnosis not present

## 2014-10-22 DIAGNOSIS — Z7982 Long term (current) use of aspirin: Secondary | ICD-10-CM | POA: Diagnosis not present

## 2014-10-23 DIAGNOSIS — F411 Generalized anxiety disorder: Secondary | ICD-10-CM | POA: Diagnosis not present

## 2014-10-23 DIAGNOSIS — Z23 Encounter for immunization: Secondary | ICD-10-CM | POA: Diagnosis not present

## 2014-10-30 DIAGNOSIS — F411 Generalized anxiety disorder: Secondary | ICD-10-CM | POA: Diagnosis not present

## 2014-10-30 DIAGNOSIS — F329 Major depressive disorder, single episode, unspecified: Secondary | ICD-10-CM | POA: Diagnosis not present

## 2014-11-07 DIAGNOSIS — F411 Generalized anxiety disorder: Secondary | ICD-10-CM | POA: Diagnosis not present

## 2014-11-26 DIAGNOSIS — F411 Generalized anxiety disorder: Secondary | ICD-10-CM | POA: Diagnosis not present

## 2014-12-24 DIAGNOSIS — F329 Major depressive disorder, single episode, unspecified: Secondary | ICD-10-CM | POA: Diagnosis not present

## 2014-12-24 DIAGNOSIS — F411 Generalized anxiety disorder: Secondary | ICD-10-CM | POA: Diagnosis not present

## 2015-02-25 DIAGNOSIS — F411 Generalized anxiety disorder: Secondary | ICD-10-CM | POA: Diagnosis not present

## 2015-02-25 DIAGNOSIS — F329 Major depressive disorder, single episode, unspecified: Secondary | ICD-10-CM | POA: Diagnosis not present

## 2015-03-18 DIAGNOSIS — H4052X1 Glaucoma secondary to other eye disorders, left eye, mild stage: Secondary | ICD-10-CM | POA: Diagnosis not present

## 2015-03-18 DIAGNOSIS — Z9842 Cataract extraction status, left eye: Secondary | ICD-10-CM | POA: Diagnosis not present

## 2015-03-18 DIAGNOSIS — Z961 Presence of intraocular lens: Secondary | ICD-10-CM | POA: Diagnosis not present

## 2015-03-18 DIAGNOSIS — H3581 Retinal edema: Secondary | ICD-10-CM | POA: Diagnosis not present

## 2015-03-18 DIAGNOSIS — Z9841 Cataract extraction status, right eye: Secondary | ICD-10-CM | POA: Diagnosis not present

## 2015-03-20 DIAGNOSIS — H4042X3 Glaucoma secondary to eye inflammation, left eye, severe stage: Secondary | ICD-10-CM | POA: Diagnosis not present

## 2015-04-16 DIAGNOSIS — J329 Chronic sinusitis, unspecified: Secondary | ICD-10-CM | POA: Diagnosis not present

## 2015-04-16 DIAGNOSIS — B9789 Other viral agents as the cause of diseases classified elsewhere: Secondary | ICD-10-CM | POA: Diagnosis not present

## 2015-05-20 DIAGNOSIS — F329 Major depressive disorder, single episode, unspecified: Secondary | ICD-10-CM | POA: Diagnosis not present

## 2015-05-20 DIAGNOSIS — F411 Generalized anxiety disorder: Secondary | ICD-10-CM | POA: Diagnosis not present

## 2015-07-24 DIAGNOSIS — F411 Generalized anxiety disorder: Secondary | ICD-10-CM | POA: Diagnosis not present

## 2015-08-21 DIAGNOSIS — D0512 Intraductal carcinoma in situ of left breast: Secondary | ICD-10-CM | POA: Diagnosis not present

## 2015-08-21 DIAGNOSIS — Z9012 Acquired absence of left breast and nipple: Secondary | ICD-10-CM | POA: Diagnosis not present

## 2015-08-21 DIAGNOSIS — Z79810 Long term (current) use of selective estrogen receptor modulators (SERMs): Secondary | ICD-10-CM | POA: Diagnosis not present

## 2015-08-21 DIAGNOSIS — I10 Essential (primary) hypertension: Secondary | ICD-10-CM | POA: Diagnosis not present

## 2015-08-21 DIAGNOSIS — R921 Mammographic calcification found on diagnostic imaging of breast: Secondary | ICD-10-CM | POA: Diagnosis not present

## 2015-08-21 DIAGNOSIS — Z923 Personal history of irradiation: Secondary | ICD-10-CM | POA: Diagnosis not present

## 2015-08-21 DIAGNOSIS — Z86000 Personal history of in-situ neoplasm of breast: Secondary | ICD-10-CM | POA: Diagnosis not present

## 2015-08-21 DIAGNOSIS — Z08 Encounter for follow-up examination after completed treatment for malignant neoplasm: Secondary | ICD-10-CM | POA: Diagnosis not present

## 2015-08-21 DIAGNOSIS — Z170 Estrogen receptor positive status [ER+]: Secondary | ICD-10-CM | POA: Diagnosis not present

## 2015-08-21 DIAGNOSIS — F411 Generalized anxiety disorder: Secondary | ICD-10-CM | POA: Diagnosis not present

## 2015-08-21 DIAGNOSIS — R922 Inconclusive mammogram: Secondary | ICD-10-CM | POA: Diagnosis not present

## 2015-10-09 DIAGNOSIS — Z23 Encounter for immunization: Secondary | ICD-10-CM | POA: Diagnosis not present

## 2015-10-14 DIAGNOSIS — F329 Major depressive disorder, single episode, unspecified: Secondary | ICD-10-CM | POA: Diagnosis not present

## 2015-10-14 DIAGNOSIS — F411 Generalized anxiety disorder: Secondary | ICD-10-CM | POA: Diagnosis not present

## 2015-10-20 DIAGNOSIS — I10 Essential (primary) hypertension: Secondary | ICD-10-CM | POA: Diagnosis not present

## 2015-10-20 DIAGNOSIS — F411 Generalized anxiety disorder: Secondary | ICD-10-CM | POA: Diagnosis not present

## 2015-10-20 DIAGNOSIS — F329 Major depressive disorder, single episode, unspecified: Secondary | ICD-10-CM | POA: Diagnosis not present

## 2015-10-20 DIAGNOSIS — D0512 Intraductal carcinoma in situ of left breast: Secondary | ICD-10-CM | POA: Diagnosis not present

## 2015-10-20 DIAGNOSIS — D126 Benign neoplasm of colon, unspecified: Secondary | ICD-10-CM | POA: Diagnosis not present

## 2015-11-11 DIAGNOSIS — F411 Generalized anxiety disorder: Secondary | ICD-10-CM | POA: Diagnosis not present

## 2015-11-11 DIAGNOSIS — F329 Major depressive disorder, single episode, unspecified: Secondary | ICD-10-CM | POA: Diagnosis not present

## 2015-12-18 DIAGNOSIS — H4042X3 Glaucoma secondary to eye inflammation, left eye, severe stage: Secondary | ICD-10-CM | POA: Diagnosis not present

## 2016-02-10 DIAGNOSIS — F411 Generalized anxiety disorder: Secondary | ICD-10-CM | POA: Diagnosis not present

## 2016-02-10 DIAGNOSIS — F329 Major depressive disorder, single episode, unspecified: Secondary | ICD-10-CM | POA: Diagnosis not present

## 2016-02-19 DIAGNOSIS — Z86000 Personal history of in-situ neoplasm of breast: Secondary | ICD-10-CM | POA: Diagnosis not present

## 2016-02-19 DIAGNOSIS — Z09 Encounter for follow-up examination after completed treatment for conditions other than malignant neoplasm: Secondary | ICD-10-CM | POA: Diagnosis not present

## 2016-02-19 DIAGNOSIS — R922 Inconclusive mammogram: Secondary | ICD-10-CM | POA: Diagnosis not present

## 2016-02-19 DIAGNOSIS — D0512 Intraductal carcinoma in situ of left breast: Secondary | ICD-10-CM | POA: Diagnosis not present

## 2016-03-08 DIAGNOSIS — H4042X3 Glaucoma secondary to eye inflammation, left eye, severe stage: Secondary | ICD-10-CM | POA: Diagnosis not present

## 2016-03-18 DIAGNOSIS — F411 Generalized anxiety disorder: Secondary | ICD-10-CM | POA: Diagnosis not present

## 2016-03-18 DIAGNOSIS — F329 Major depressive disorder, single episode, unspecified: Secondary | ICD-10-CM | POA: Diagnosis not present

## 2016-05-03 DIAGNOSIS — F329 Major depressive disorder, single episode, unspecified: Secondary | ICD-10-CM | POA: Diagnosis not present

## 2016-05-03 DIAGNOSIS — F411 Generalized anxiety disorder: Secondary | ICD-10-CM | POA: Diagnosis not present

## 2016-07-08 DIAGNOSIS — H4042X3 Glaucoma secondary to eye inflammation, left eye, severe stage: Secondary | ICD-10-CM | POA: Diagnosis not present

## 2016-07-08 DIAGNOSIS — H539 Unspecified visual disturbance: Secondary | ICD-10-CM | POA: Diagnosis not present

## 2016-08-19 DIAGNOSIS — N6322 Unspecified lump in the left breast, upper inner quadrant: Secondary | ICD-10-CM | POA: Diagnosis not present

## 2016-08-19 DIAGNOSIS — D0512 Intraductal carcinoma in situ of left breast: Secondary | ICD-10-CM | POA: Diagnosis not present

## 2016-08-19 DIAGNOSIS — Z79810 Long term (current) use of selective estrogen receptor modulators (SERMs): Secondary | ICD-10-CM | POA: Diagnosis not present

## 2016-08-19 DIAGNOSIS — F329 Major depressive disorder, single episode, unspecified: Secondary | ICD-10-CM | POA: Diagnosis not present

## 2016-08-19 DIAGNOSIS — Z853 Personal history of malignant neoplasm of breast: Secondary | ICD-10-CM | POA: Diagnosis not present

## 2016-08-19 DIAGNOSIS — N6320 Unspecified lump in the left breast, unspecified quadrant: Secondary | ICD-10-CM | POA: Diagnosis not present

## 2016-08-19 DIAGNOSIS — Z08 Encounter for follow-up examination after completed treatment for malignant neoplasm: Secondary | ICD-10-CM | POA: Diagnosis not present

## 2016-08-19 DIAGNOSIS — D0510 Intraductal carcinoma in situ of unspecified breast: Secondary | ICD-10-CM | POA: Diagnosis not present

## 2016-08-19 DIAGNOSIS — Z7982 Long term (current) use of aspirin: Secondary | ICD-10-CM | POA: Diagnosis not present

## 2016-08-19 DIAGNOSIS — N6323 Unspecified lump in the left breast, lower outer quadrant: Secondary | ICD-10-CM | POA: Diagnosis not present

## 2016-08-19 DIAGNOSIS — I10 Essential (primary) hypertension: Secondary | ICD-10-CM | POA: Diagnosis not present

## 2016-08-19 DIAGNOSIS — H409 Unspecified glaucoma: Secondary | ICD-10-CM | POA: Diagnosis not present

## 2016-08-30 DIAGNOSIS — N6002 Solitary cyst of left breast: Secondary | ICD-10-CM | POA: Diagnosis not present

## 2016-10-04 DIAGNOSIS — I10 Essential (primary) hypertension: Secondary | ICD-10-CM | POA: Diagnosis not present

## 2016-10-04 DIAGNOSIS — F411 Generalized anxiety disorder: Secondary | ICD-10-CM | POA: Diagnosis not present

## 2016-10-04 DIAGNOSIS — J309 Allergic rhinitis, unspecified: Secondary | ICD-10-CM | POA: Diagnosis not present

## 2016-10-04 DIAGNOSIS — G2581 Restless legs syndrome: Secondary | ICD-10-CM | POA: Diagnosis not present

## 2016-10-04 DIAGNOSIS — Z23 Encounter for immunization: Secondary | ICD-10-CM | POA: Diagnosis not present

## 2016-10-08 DIAGNOSIS — Z780 Asymptomatic menopausal state: Secondary | ICD-10-CM | POA: Diagnosis not present

## 2016-10-08 DIAGNOSIS — M85852 Other specified disorders of bone density and structure, left thigh: Secondary | ICD-10-CM | POA: Diagnosis not present

## 2016-10-08 DIAGNOSIS — M8588 Other specified disorders of bone density and structure, other site: Secondary | ICD-10-CM | POA: Diagnosis not present

## 2016-11-01 DIAGNOSIS — I10 Essential (primary) hypertension: Secondary | ICD-10-CM | POA: Diagnosis not present

## 2016-11-01 DIAGNOSIS — F329 Major depressive disorder, single episode, unspecified: Secondary | ICD-10-CM | POA: Diagnosis not present

## 2016-11-01 DIAGNOSIS — F411 Generalized anxiety disorder: Secondary | ICD-10-CM | POA: Diagnosis not present

## 2016-11-01 DIAGNOSIS — G2581 Restless legs syndrome: Secondary | ICD-10-CM | POA: Diagnosis not present

## 2016-11-04 DIAGNOSIS — H4042X3 Glaucoma secondary to eye inflammation, left eye, severe stage: Secondary | ICD-10-CM | POA: Diagnosis not present

## 2016-11-26 DIAGNOSIS — G2581 Restless legs syndrome: Secondary | ICD-10-CM | POA: Diagnosis not present

## 2016-11-26 DIAGNOSIS — I10 Essential (primary) hypertension: Secondary | ICD-10-CM | POA: Diagnosis not present

## 2016-11-26 DIAGNOSIS — F411 Generalized anxiety disorder: Secondary | ICD-10-CM | POA: Diagnosis not present

## 2016-12-29 DIAGNOSIS — F329 Major depressive disorder, single episode, unspecified: Secondary | ICD-10-CM | POA: Diagnosis not present

## 2016-12-29 DIAGNOSIS — R790 Abnormal level of blood mineral: Secondary | ICD-10-CM | POA: Diagnosis not present

## 2016-12-29 DIAGNOSIS — I10 Essential (primary) hypertension: Secondary | ICD-10-CM | POA: Diagnosis not present

## 2016-12-29 DIAGNOSIS — G2581 Restless legs syndrome: Secondary | ICD-10-CM | POA: Diagnosis not present

## 2016-12-29 DIAGNOSIS — R5383 Other fatigue: Secondary | ICD-10-CM | POA: Diagnosis not present

## 2016-12-29 DIAGNOSIS — F411 Generalized anxiety disorder: Secondary | ICD-10-CM | POA: Diagnosis not present

## 2017-01-14 DIAGNOSIS — F419 Anxiety disorder, unspecified: Secondary | ICD-10-CM | POA: Diagnosis not present

## 2017-02-04 DIAGNOSIS — F329 Major depressive disorder, single episode, unspecified: Secondary | ICD-10-CM | POA: Diagnosis not present

## 2017-02-04 DIAGNOSIS — F411 Generalized anxiety disorder: Secondary | ICD-10-CM | POA: Diagnosis not present

## 2017-02-04 DIAGNOSIS — J309 Allergic rhinitis, unspecified: Secondary | ICD-10-CM | POA: Diagnosis not present

## 2017-02-04 DIAGNOSIS — H8110 Benign paroxysmal vertigo, unspecified ear: Secondary | ICD-10-CM | POA: Diagnosis not present

## 2017-02-04 DIAGNOSIS — I10 Essential (primary) hypertension: Secondary | ICD-10-CM | POA: Diagnosis not present

## 2017-03-03 DIAGNOSIS — H4042X3 Glaucoma secondary to eye inflammation, left eye, severe stage: Secondary | ICD-10-CM | POA: Diagnosis not present

## 2017-03-29 DIAGNOSIS — I10 Essential (primary) hypertension: Secondary | ICD-10-CM | POA: Diagnosis not present

## 2017-03-29 DIAGNOSIS — H8110 Benign paroxysmal vertigo, unspecified ear: Secondary | ICD-10-CM | POA: Diagnosis not present

## 2017-03-29 DIAGNOSIS — J309 Allergic rhinitis, unspecified: Secondary | ICD-10-CM | POA: Diagnosis not present

## 2017-07-04 DIAGNOSIS — H4042X3 Glaucoma secondary to eye inflammation, left eye, severe stage: Secondary | ICD-10-CM | POA: Diagnosis not present

## 2017-07-13 DIAGNOSIS — F411 Generalized anxiety disorder: Secondary | ICD-10-CM | POA: Diagnosis not present

## 2017-07-13 DIAGNOSIS — H8110 Benign paroxysmal vertigo, unspecified ear: Secondary | ICD-10-CM | POA: Diagnosis not present

## 2017-07-13 DIAGNOSIS — J329 Chronic sinusitis, unspecified: Secondary | ICD-10-CM | POA: Diagnosis not present

## 2017-07-13 DIAGNOSIS — Z1211 Encounter for screening for malignant neoplasm of colon: Secondary | ICD-10-CM | POA: Diagnosis not present

## 2017-07-13 DIAGNOSIS — F329 Major depressive disorder, single episode, unspecified: Secondary | ICD-10-CM | POA: Diagnosis not present

## 2017-07-25 DIAGNOSIS — J300 Vasomotor rhinitis: Secondary | ICD-10-CM | POA: Diagnosis not present

## 2017-07-25 DIAGNOSIS — R42 Dizziness and giddiness: Secondary | ICD-10-CM | POA: Diagnosis not present

## 2017-07-25 DIAGNOSIS — I951 Orthostatic hypotension: Secondary | ICD-10-CM | POA: Diagnosis not present

## 2017-07-25 DIAGNOSIS — H903 Sensorineural hearing loss, bilateral: Secondary | ICD-10-CM | POA: Diagnosis not present

## 2017-07-26 DIAGNOSIS — I10 Essential (primary) hypertension: Secondary | ICD-10-CM | POA: Diagnosis not present

## 2017-07-26 DIAGNOSIS — Z13220 Encounter for screening for lipoid disorders: Secondary | ICD-10-CM | POA: Diagnosis not present

## 2017-07-26 DIAGNOSIS — Z0000 Encounter for general adult medical examination without abnormal findings: Secondary | ICD-10-CM | POA: Diagnosis not present

## 2017-07-26 DIAGNOSIS — R42 Dizziness and giddiness: Secondary | ICD-10-CM | POA: Diagnosis not present

## 2017-08-05 DIAGNOSIS — E785 Hyperlipidemia, unspecified: Secondary | ICD-10-CM | POA: Diagnosis not present

## 2017-08-05 DIAGNOSIS — I10 Essential (primary) hypertension: Secondary | ICD-10-CM | POA: Diagnosis not present

## 2017-08-05 DIAGNOSIS — R42 Dizziness and giddiness: Secondary | ICD-10-CM | POA: Diagnosis not present

## 2017-08-16 DIAGNOSIS — I10 Essential (primary) hypertension: Secondary | ICD-10-CM | POA: Diagnosis not present

## 2017-08-18 DIAGNOSIS — F411 Generalized anxiety disorder: Secondary | ICD-10-CM | POA: Diagnosis not present

## 2017-08-18 DIAGNOSIS — R42 Dizziness and giddiness: Secondary | ICD-10-CM | POA: Diagnosis not present

## 2017-08-18 DIAGNOSIS — I10 Essential (primary) hypertension: Secondary | ICD-10-CM | POA: Diagnosis not present

## 2017-08-25 DIAGNOSIS — Z170 Estrogen receptor positive status [ER+]: Secondary | ICD-10-CM | POA: Diagnosis not present

## 2017-08-25 DIAGNOSIS — D0512 Intraductal carcinoma in situ of left breast: Secondary | ICD-10-CM | POA: Diagnosis not present

## 2017-08-25 DIAGNOSIS — D0510 Intraductal carcinoma in situ of unspecified breast: Secondary | ICD-10-CM | POA: Diagnosis not present

## 2017-09-01 DIAGNOSIS — I10 Essential (primary) hypertension: Secondary | ICD-10-CM | POA: Diagnosis not present

## 2017-09-01 DIAGNOSIS — K5730 Diverticulosis of large intestine without perforation or abscess without bleeding: Secondary | ICD-10-CM | POA: Diagnosis not present

## 2017-09-01 DIAGNOSIS — D369 Benign neoplasm, unspecified site: Secondary | ICD-10-CM | POA: Diagnosis not present

## 2017-09-01 DIAGNOSIS — K648 Other hemorrhoids: Secondary | ICD-10-CM | POA: Diagnosis not present

## 2017-09-01 DIAGNOSIS — K635 Polyp of colon: Secondary | ICD-10-CM | POA: Diagnosis not present

## 2017-09-01 DIAGNOSIS — Z1211 Encounter for screening for malignant neoplasm of colon: Secondary | ICD-10-CM | POA: Diagnosis not present

## 2017-09-01 DIAGNOSIS — Z86010 Personal history of colonic polyps: Secondary | ICD-10-CM | POA: Diagnosis not present

## 2017-09-01 DIAGNOSIS — D124 Benign neoplasm of descending colon: Secondary | ICD-10-CM | POA: Diagnosis not present

## 2017-09-01 DIAGNOSIS — Z7982 Long term (current) use of aspirin: Secondary | ICD-10-CM | POA: Diagnosis not present

## 2017-09-01 DIAGNOSIS — K640 First degree hemorrhoids: Secondary | ICD-10-CM | POA: Diagnosis not present

## 2017-10-27 DIAGNOSIS — F329 Major depressive disorder, single episode, unspecified: Secondary | ICD-10-CM | POA: Diagnosis not present

## 2017-10-27 DIAGNOSIS — F419 Anxiety disorder, unspecified: Secondary | ICD-10-CM | POA: Diagnosis not present

## 2017-10-27 DIAGNOSIS — Z23 Encounter for immunization: Secondary | ICD-10-CM | POA: Diagnosis not present

## 2017-10-27 DIAGNOSIS — L821 Other seborrheic keratosis: Secondary | ICD-10-CM | POA: Diagnosis not present

## 2017-10-27 DIAGNOSIS — F411 Generalized anxiety disorder: Secondary | ICD-10-CM | POA: Diagnosis not present

## 2017-11-03 DIAGNOSIS — Z85828 Personal history of other malignant neoplasm of skin: Secondary | ICD-10-CM | POA: Diagnosis not present

## 2017-11-03 DIAGNOSIS — L821 Other seborrheic keratosis: Secondary | ICD-10-CM | POA: Diagnosis not present

## 2017-11-03 DIAGNOSIS — Z85820 Personal history of malignant melanoma of skin: Secondary | ICD-10-CM | POA: Diagnosis not present

## 2017-11-28 DIAGNOSIS — I10 Essential (primary) hypertension: Secondary | ICD-10-CM | POA: Diagnosis not present

## 2018-01-24 DIAGNOSIS — I10 Essential (primary) hypertension: Secondary | ICD-10-CM | POA: Diagnosis not present

## 2018-01-24 DIAGNOSIS — F411 Generalized anxiety disorder: Secondary | ICD-10-CM | POA: Diagnosis not present

## 2018-01-24 DIAGNOSIS — G2581 Restless legs syndrome: Secondary | ICD-10-CM | POA: Diagnosis not present

## 2018-03-09 DIAGNOSIS — H4042X3 Glaucoma secondary to eye inflammation, left eye, severe stage: Secondary | ICD-10-CM | POA: Diagnosis not present

## 2018-05-29 DIAGNOSIS — N644 Mastodynia: Secondary | ICD-10-CM | POA: Diagnosis not present

## 2018-05-29 DIAGNOSIS — D0512 Intraductal carcinoma in situ of left breast: Secondary | ICD-10-CM | POA: Diagnosis not present

## 2018-05-29 DIAGNOSIS — I10 Essential (primary) hypertension: Secondary | ICD-10-CM | POA: Diagnosis not present

## 2018-05-29 DIAGNOSIS — G2581 Restless legs syndrome: Secondary | ICD-10-CM | POA: Diagnosis not present

## 2018-06-08 DIAGNOSIS — D0512 Intraductal carcinoma in situ of left breast: Secondary | ICD-10-CM | POA: Diagnosis not present

## 2018-06-08 DIAGNOSIS — N644 Mastodynia: Secondary | ICD-10-CM | POA: Diagnosis not present

## 2018-06-08 DIAGNOSIS — R922 Inconclusive mammogram: Secondary | ICD-10-CM | POA: Diagnosis not present

## 2018-06-08 DIAGNOSIS — Z9889 Other specified postprocedural states: Secondary | ICD-10-CM | POA: Diagnosis not present

## 2018-06-12 DIAGNOSIS — Z853 Personal history of malignant neoplasm of breast: Secondary | ICD-10-CM | POA: Diagnosis not present

## 2018-06-12 DIAGNOSIS — N644 Mastodynia: Secondary | ICD-10-CM | POA: Diagnosis not present

## 2018-08-14 DIAGNOSIS — N644 Mastodynia: Secondary | ICD-10-CM | POA: Diagnosis not present

## 2018-08-14 DIAGNOSIS — F411 Generalized anxiety disorder: Secondary | ICD-10-CM | POA: Diagnosis not present

## 2018-08-14 DIAGNOSIS — F329 Major depressive disorder, single episode, unspecified: Secondary | ICD-10-CM | POA: Diagnosis not present

## 2018-09-14 DIAGNOSIS — H4052X3 Glaucoma secondary to other eye disorders, left eye, severe stage: Secondary | ICD-10-CM | POA: Diagnosis not present

## 2018-10-20 DIAGNOSIS — F411 Generalized anxiety disorder: Secondary | ICD-10-CM | POA: Diagnosis not present

## 2018-10-20 DIAGNOSIS — Z23 Encounter for immunization: Secondary | ICD-10-CM | POA: Diagnosis not present

## 2018-10-20 DIAGNOSIS — I10 Essential (primary) hypertension: Secondary | ICD-10-CM | POA: Diagnosis not present

## 2018-10-20 DIAGNOSIS — N644 Mastodynia: Secondary | ICD-10-CM | POA: Diagnosis not present

## 2019-04-14 DIAGNOSIS — R0602 Shortness of breath: Secondary | ICD-10-CM | POA: Diagnosis not present

## 2019-04-14 DIAGNOSIS — Z20822 Contact with and (suspected) exposure to covid-19: Secondary | ICD-10-CM | POA: Diagnosis not present

## 2019-04-14 DIAGNOSIS — R918 Other nonspecific abnormal finding of lung field: Secondary | ICD-10-CM | POA: Diagnosis not present

## 2019-04-14 DIAGNOSIS — Z853 Personal history of malignant neoplasm of breast: Secondary | ICD-10-CM | POA: Diagnosis not present

## 2019-04-14 DIAGNOSIS — M791 Myalgia, unspecified site: Secondary | ICD-10-CM | POA: Diagnosis not present

## 2019-04-14 DIAGNOSIS — R0600 Dyspnea, unspecified: Secondary | ICD-10-CM | POA: Diagnosis not present

## 2019-04-14 DIAGNOSIS — R509 Fever, unspecified: Secondary | ICD-10-CM | POA: Diagnosis not present

## 2019-04-14 DIAGNOSIS — R05 Cough: Secondary | ICD-10-CM | POA: Diagnosis not present

## 2019-04-14 DIAGNOSIS — I10 Essential (primary) hypertension: Secondary | ICD-10-CM | POA: Diagnosis not present

## 2019-04-19 DIAGNOSIS — Z853 Personal history of malignant neoplasm of breast: Secondary | ICD-10-CM | POA: Diagnosis not present

## 2019-04-19 DIAGNOSIS — Z85820 Personal history of malignant melanoma of skin: Secondary | ICD-10-CM | POA: Diagnosis not present

## 2019-04-19 DIAGNOSIS — Z85828 Personal history of other malignant neoplasm of skin: Secondary | ICD-10-CM | POA: Diagnosis not present

## 2019-04-19 DIAGNOSIS — Z79899 Other long term (current) drug therapy: Secondary | ICD-10-CM | POA: Diagnosis not present

## 2019-04-19 DIAGNOSIS — R918 Other nonspecific abnormal finding of lung field: Secondary | ICD-10-CM | POA: Diagnosis not present

## 2019-04-19 DIAGNOSIS — Z7982 Long term (current) use of aspirin: Secondary | ICD-10-CM | POA: Diagnosis not present

## 2019-04-19 DIAGNOSIS — F329 Major depressive disorder, single episode, unspecified: Secondary | ICD-10-CM | POA: Diagnosis not present

## 2019-04-19 DIAGNOSIS — J189 Pneumonia, unspecified organism: Secondary | ICD-10-CM | POA: Diagnosis not present

## 2019-04-19 DIAGNOSIS — R05 Cough: Secondary | ICD-10-CM | POA: Diagnosis not present

## 2019-04-19 DIAGNOSIS — I10 Essential (primary) hypertension: Secondary | ICD-10-CM | POA: Diagnosis not present

## 2019-04-19 DIAGNOSIS — R197 Diarrhea, unspecified: Secondary | ICD-10-CM | POA: Diagnosis not present

## 2019-04-19 DIAGNOSIS — R0602 Shortness of breath: Secondary | ICD-10-CM | POA: Diagnosis not present

## 2019-05-30 DIAGNOSIS — N644 Mastodynia: Secondary | ICD-10-CM | POA: Diagnosis not present

## 2019-05-30 DIAGNOSIS — F411 Generalized anxiety disorder: Secondary | ICD-10-CM | POA: Diagnosis not present

## 2019-05-30 DIAGNOSIS — I10 Essential (primary) hypertension: Secondary | ICD-10-CM | POA: Diagnosis not present

## 2019-06-22 DIAGNOSIS — H4052X1 Glaucoma secondary to other eye disorders, left eye, mild stage: Secondary | ICD-10-CM | POA: Diagnosis not present

## 2019-07-27 DIAGNOSIS — G8929 Other chronic pain: Secondary | ICD-10-CM | POA: Diagnosis not present

## 2019-07-27 DIAGNOSIS — M545 Low back pain: Secondary | ICD-10-CM | POA: Diagnosis not present

## 2019-07-27 DIAGNOSIS — R339 Retention of urine, unspecified: Secondary | ICD-10-CM | POA: Diagnosis not present

## 2019-08-16 DIAGNOSIS — Z981 Arthrodesis status: Secondary | ICD-10-CM | POA: Diagnosis not present

## 2019-08-16 DIAGNOSIS — M19011 Primary osteoarthritis, right shoulder: Secondary | ICD-10-CM | POA: Diagnosis not present

## 2019-08-16 DIAGNOSIS — Z803 Family history of malignant neoplasm of breast: Secondary | ICD-10-CM | POA: Diagnosis not present

## 2019-08-16 DIAGNOSIS — R918 Other nonspecific abnormal finding of lung field: Secondary | ICD-10-CM | POA: Diagnosis not present

## 2019-08-16 DIAGNOSIS — M25511 Pain in right shoulder: Secondary | ICD-10-CM | POA: Diagnosis not present

## 2019-08-16 DIAGNOSIS — G319 Degenerative disease of nervous system, unspecified: Secondary | ICD-10-CM | POA: Diagnosis not present

## 2019-08-16 DIAGNOSIS — I10 Essential (primary) hypertension: Secondary | ICD-10-CM | POA: Diagnosis not present

## 2019-08-16 DIAGNOSIS — M79601 Pain in right arm: Secondary | ICD-10-CM | POA: Diagnosis not present

## 2019-08-16 DIAGNOSIS — R112 Nausea with vomiting, unspecified: Secondary | ICD-10-CM | POA: Diagnosis not present

## 2019-08-16 DIAGNOSIS — M25411 Effusion, right shoulder: Secondary | ICD-10-CM | POA: Diagnosis not present

## 2019-08-16 DIAGNOSIS — S42351A Displaced comminuted fracture of shaft of humerus, right arm, initial encounter for closed fracture: Secondary | ICD-10-CM | POA: Diagnosis not present

## 2019-08-16 DIAGNOSIS — R079 Chest pain, unspecified: Secondary | ICD-10-CM | POA: Diagnosis not present

## 2019-08-16 DIAGNOSIS — S199XXA Unspecified injury of neck, initial encounter: Secondary | ICD-10-CM | POA: Diagnosis not present

## 2019-08-16 DIAGNOSIS — Z9012 Acquired absence of left breast and nipple: Secondary | ICD-10-CM | POA: Diagnosis not present

## 2019-08-16 DIAGNOSIS — S42201A Unspecified fracture of upper end of right humerus, initial encounter for closed fracture: Secondary | ICD-10-CM | POA: Diagnosis not present

## 2019-08-16 DIAGNOSIS — Z8249 Family history of ischemic heart disease and other diseases of the circulatory system: Secondary | ICD-10-CM | POA: Diagnosis not present

## 2019-08-16 DIAGNOSIS — S0990XA Unspecified injury of head, initial encounter: Secondary | ICD-10-CM | POA: Diagnosis not present

## 2019-08-28 DIAGNOSIS — Z791 Long term (current) use of non-steroidal anti-inflammatories (NSAID): Secondary | ICD-10-CM | POA: Diagnosis not present

## 2019-08-28 DIAGNOSIS — Z79899 Other long term (current) drug therapy: Secondary | ICD-10-CM | POA: Diagnosis not present

## 2019-08-28 DIAGNOSIS — S42291A Other displaced fracture of upper end of right humerus, initial encounter for closed fracture: Secondary | ICD-10-CM | POA: Diagnosis not present

## 2019-08-28 DIAGNOSIS — R1110 Vomiting, unspecified: Secondary | ICD-10-CM | POA: Diagnosis not present

## 2019-08-28 DIAGNOSIS — I10 Essential (primary) hypertension: Secondary | ICD-10-CM | POA: Diagnosis not present

## 2019-08-30 DIAGNOSIS — S42201A Unspecified fracture of upper end of right humerus, initial encounter for closed fracture: Secondary | ICD-10-CM | POA: Diagnosis not present

## 2019-08-31 DIAGNOSIS — R0902 Hypoxemia: Secondary | ICD-10-CM | POA: Diagnosis not present

## 2019-08-31 DIAGNOSIS — G8918 Other acute postprocedural pain: Secondary | ICD-10-CM | POA: Diagnosis not present

## 2019-08-31 DIAGNOSIS — R918 Other nonspecific abnormal finding of lung field: Secondary | ICD-10-CM | POA: Diagnosis not present

## 2019-08-31 DIAGNOSIS — Z7982 Long term (current) use of aspirin: Secondary | ICD-10-CM | POA: Diagnosis not present

## 2019-08-31 DIAGNOSIS — G479 Sleep disorder, unspecified: Secondary | ICD-10-CM | POA: Diagnosis not present

## 2019-08-31 DIAGNOSIS — I10 Essential (primary) hypertension: Secondary | ICD-10-CM | POA: Diagnosis not present

## 2019-08-31 DIAGNOSIS — I499 Cardiac arrhythmia, unspecified: Secondary | ICD-10-CM | POA: Diagnosis not present

## 2019-08-31 DIAGNOSIS — R9389 Abnormal findings on diagnostic imaging of other specified body structures: Secondary | ICD-10-CM | POA: Diagnosis not present

## 2019-08-31 DIAGNOSIS — J9811 Atelectasis: Secondary | ICD-10-CM | POA: Diagnosis not present

## 2019-08-31 DIAGNOSIS — R0681 Apnea, not elsewhere classified: Secondary | ICD-10-CM | POA: Diagnosis not present

## 2019-08-31 DIAGNOSIS — F329 Major depressive disorder, single episode, unspecified: Secondary | ICD-10-CM | POA: Diagnosis not present

## 2019-08-31 DIAGNOSIS — R05 Cough: Secondary | ICD-10-CM | POA: Diagnosis not present

## 2019-08-31 DIAGNOSIS — H409 Unspecified glaucoma: Secondary | ICD-10-CM | POA: Diagnosis not present

## 2019-08-31 DIAGNOSIS — N644 Mastodynia: Secondary | ICD-10-CM | POA: Diagnosis not present

## 2019-08-31 DIAGNOSIS — S42201A Unspecified fracture of upper end of right humerus, initial encounter for closed fracture: Secondary | ICD-10-CM | POA: Diagnosis not present

## 2019-08-31 DIAGNOSIS — Z853 Personal history of malignant neoplasm of breast: Secondary | ICD-10-CM | POA: Diagnosis not present

## 2019-08-31 DIAGNOSIS — K5900 Constipation, unspecified: Secondary | ICD-10-CM | POA: Diagnosis not present

## 2019-08-31 DIAGNOSIS — Z9012 Acquired absence of left breast and nipple: Secondary | ICD-10-CM | POA: Diagnosis not present

## 2019-08-31 DIAGNOSIS — S42291A Other displaced fracture of upper end of right humerus, initial encounter for closed fracture: Secondary | ICD-10-CM | POA: Diagnosis not present

## 2019-09-18 DIAGNOSIS — S42354D Nondisplaced comminuted fracture of shaft of humerus, right arm, subsequent encounter for fracture with routine healing: Secondary | ICD-10-CM | POA: Diagnosis not present

## 2019-09-18 DIAGNOSIS — S42351D Displaced comminuted fracture of shaft of humerus, right arm, subsequent encounter for fracture with routine healing: Secondary | ICD-10-CM | POA: Diagnosis not present

## 2019-10-15 DIAGNOSIS — F411 Generalized anxiety disorder: Secondary | ICD-10-CM | POA: Diagnosis not present

## 2019-10-15 DIAGNOSIS — Z7982 Long term (current) use of aspirin: Secondary | ICD-10-CM | POA: Diagnosis not present

## 2019-10-15 DIAGNOSIS — R531 Weakness: Secondary | ICD-10-CM | POA: Diagnosis not present

## 2019-10-15 DIAGNOSIS — F32A Depression, unspecified: Secondary | ICD-10-CM | POA: Diagnosis not present

## 2019-10-15 DIAGNOSIS — K625 Hemorrhage of anus and rectum: Secondary | ICD-10-CM | POA: Diagnosis not present

## 2019-10-15 DIAGNOSIS — K922 Gastrointestinal hemorrhage, unspecified: Secondary | ICD-10-CM | POA: Diagnosis not present

## 2019-10-15 DIAGNOSIS — E785 Hyperlipidemia, unspecified: Secondary | ICD-10-CM | POA: Diagnosis not present

## 2019-10-15 DIAGNOSIS — G8911 Acute pain due to trauma: Secondary | ICD-10-CM | POA: Diagnosis not present

## 2019-10-15 DIAGNOSIS — R918 Other nonspecific abnormal finding of lung field: Secondary | ICD-10-CM | POA: Diagnosis not present

## 2019-10-15 DIAGNOSIS — K250 Acute gastric ulcer with hemorrhage: Secondary | ICD-10-CM | POA: Diagnosis not present

## 2019-10-15 DIAGNOSIS — F441 Dissociative fugue: Secondary | ICD-10-CM | POA: Diagnosis not present

## 2019-10-15 DIAGNOSIS — M6250 Muscle wasting and atrophy, not elsewhere classified, unspecified site: Secondary | ICD-10-CM | POA: Diagnosis not present

## 2019-10-15 DIAGNOSIS — Z20822 Contact with and (suspected) exposure to covid-19: Secondary | ICD-10-CM | POA: Diagnosis not present

## 2019-10-15 DIAGNOSIS — K920 Hematemesis: Secondary | ICD-10-CM | POA: Diagnosis not present

## 2019-10-15 DIAGNOSIS — I10 Essential (primary) hypertension: Secondary | ICD-10-CM | POA: Diagnosis not present

## 2019-10-15 DIAGNOSIS — D649 Anemia, unspecified: Secondary | ICD-10-CM | POA: Diagnosis not present

## 2019-10-15 DIAGNOSIS — K449 Diaphragmatic hernia without obstruction or gangrene: Secondary | ICD-10-CM | POA: Diagnosis not present

## 2019-10-15 DIAGNOSIS — F419 Anxiety disorder, unspecified: Secondary | ICD-10-CM | POA: Diagnosis not present

## 2019-10-15 DIAGNOSIS — D62 Acute posthemorrhagic anemia: Secondary | ICD-10-CM | POA: Diagnosis not present

## 2019-10-15 DIAGNOSIS — Z853 Personal history of malignant neoplasm of breast: Secondary | ICD-10-CM | POA: Diagnosis not present

## 2019-10-15 DIAGNOSIS — R1013 Epigastric pain: Secondary | ICD-10-CM | POA: Diagnosis not present

## 2019-10-15 DIAGNOSIS — K254 Chronic or unspecified gastric ulcer with hemorrhage: Secondary | ICD-10-CM | POA: Diagnosis not present

## 2019-10-15 DIAGNOSIS — R0602 Shortness of breath: Secondary | ICD-10-CM | POA: Diagnosis not present

## 2019-10-16 DIAGNOSIS — K449 Diaphragmatic hernia without obstruction or gangrene: Secondary | ICD-10-CM | POA: Diagnosis not present

## 2019-10-16 DIAGNOSIS — Z853 Personal history of malignant neoplasm of breast: Secondary | ICD-10-CM | POA: Diagnosis not present

## 2019-10-16 DIAGNOSIS — K254 Chronic or unspecified gastric ulcer with hemorrhage: Secondary | ICD-10-CM | POA: Diagnosis not present

## 2019-10-16 DIAGNOSIS — I10 Essential (primary) hypertension: Secondary | ICD-10-CM | POA: Diagnosis not present

## 2019-10-16 DIAGNOSIS — K920 Hematemesis: Secondary | ICD-10-CM | POA: Diagnosis not present

## 2019-10-16 DIAGNOSIS — Z7982 Long term (current) use of aspirin: Secondary | ICD-10-CM | POA: Diagnosis not present

## 2019-10-16 DIAGNOSIS — F441 Dissociative fugue: Secondary | ICD-10-CM | POA: Diagnosis not present

## 2019-10-17 DIAGNOSIS — I10 Essential (primary) hypertension: Secondary | ICD-10-CM | POA: Diagnosis not present

## 2019-10-17 DIAGNOSIS — F411 Generalized anxiety disorder: Secondary | ICD-10-CM | POA: Diagnosis not present

## 2019-10-17 DIAGNOSIS — D649 Anemia, unspecified: Secondary | ICD-10-CM | POA: Diagnosis not present

## 2019-10-17 DIAGNOSIS — K920 Hematemesis: Secondary | ICD-10-CM | POA: Diagnosis not present

## 2019-10-22 DIAGNOSIS — Z09 Encounter for follow-up examination after completed treatment for conditions other than malignant neoplasm: Secondary | ICD-10-CM | POA: Diagnosis not present

## 2019-10-22 DIAGNOSIS — F411 Generalized anxiety disorder: Secondary | ICD-10-CM | POA: Diagnosis not present

## 2019-10-22 DIAGNOSIS — S42201A Unspecified fracture of upper end of right humerus, initial encounter for closed fracture: Secondary | ICD-10-CM | POA: Diagnosis not present

## 2019-10-22 DIAGNOSIS — K259 Gastric ulcer, unspecified as acute or chronic, without hemorrhage or perforation: Secondary | ICD-10-CM | POA: Diagnosis not present

## 2019-10-22 DIAGNOSIS — Z23 Encounter for immunization: Secondary | ICD-10-CM | POA: Diagnosis not present

## 2019-10-25 DIAGNOSIS — S42201D Unspecified fracture of upper end of right humerus, subsequent encounter for fracture with routine healing: Secondary | ICD-10-CM | POA: Diagnosis not present

## 2019-10-25 DIAGNOSIS — S42291D Other displaced fracture of upper end of right humerus, subsequent encounter for fracture with routine healing: Secondary | ICD-10-CM | POA: Diagnosis not present

## 2019-11-06 DIAGNOSIS — Z9889 Other specified postprocedural states: Secondary | ICD-10-CM | POA: Diagnosis not present

## 2019-11-06 DIAGNOSIS — R29898 Other symptoms and signs involving the musculoskeletal system: Secondary | ICD-10-CM | POA: Diagnosis not present

## 2019-11-06 DIAGNOSIS — M25611 Stiffness of right shoulder, not elsewhere classified: Secondary | ICD-10-CM | POA: Diagnosis not present

## 2019-11-06 DIAGNOSIS — S42201D Unspecified fracture of upper end of right humerus, subsequent encounter for fracture with routine healing: Secondary | ICD-10-CM | POA: Diagnosis not present

## 2019-11-14 DIAGNOSIS — M25811 Other specified joint disorders, right shoulder: Secondary | ICD-10-CM | POA: Diagnosis not present

## 2019-11-14 DIAGNOSIS — S42201D Unspecified fracture of upper end of right humerus, subsequent encounter for fracture with routine healing: Secondary | ICD-10-CM | POA: Diagnosis not present

## 2019-11-14 DIAGNOSIS — Z9889 Other specified postprocedural states: Secondary | ICD-10-CM | POA: Diagnosis not present

## 2019-11-14 DIAGNOSIS — R29898 Other symptoms and signs involving the musculoskeletal system: Secondary | ICD-10-CM | POA: Diagnosis not present

## 2019-11-21 DIAGNOSIS — Z79890 Hormone replacement therapy (postmenopausal): Secondary | ICD-10-CM | POA: Diagnosis not present

## 2019-11-21 DIAGNOSIS — M25611 Stiffness of right shoulder, not elsewhere classified: Secondary | ICD-10-CM | POA: Diagnosis not present

## 2019-11-21 DIAGNOSIS — R29898 Other symptoms and signs involving the musculoskeletal system: Secondary | ICD-10-CM | POA: Diagnosis not present

## 2019-11-21 DIAGNOSIS — S42201D Unspecified fracture of upper end of right humerus, subsequent encounter for fracture with routine healing: Secondary | ICD-10-CM | POA: Diagnosis not present

## 2019-11-28 DIAGNOSIS — M25611 Stiffness of right shoulder, not elsewhere classified: Secondary | ICD-10-CM | POA: Diagnosis not present

## 2019-11-28 DIAGNOSIS — R29898 Other symptoms and signs involving the musculoskeletal system: Secondary | ICD-10-CM | POA: Diagnosis not present

## 2019-11-28 DIAGNOSIS — S42201D Unspecified fracture of upper end of right humerus, subsequent encounter for fracture with routine healing: Secondary | ICD-10-CM | POA: Diagnosis not present

## 2019-11-28 DIAGNOSIS — Z79890 Hormone replacement therapy (postmenopausal): Secondary | ICD-10-CM | POA: Diagnosis not present

## 2019-12-11 DIAGNOSIS — K2950 Unspecified chronic gastritis without bleeding: Secondary | ICD-10-CM | POA: Diagnosis not present

## 2019-12-11 DIAGNOSIS — E785 Hyperlipidemia, unspecified: Secondary | ICD-10-CM | POA: Diagnosis not present

## 2019-12-11 DIAGNOSIS — Z9289 Personal history of other medical treatment: Secondary | ICD-10-CM | POA: Diagnosis not present

## 2019-12-11 DIAGNOSIS — K259 Gastric ulcer, unspecified as acute or chronic, without hemorrhage or perforation: Secondary | ICD-10-CM | POA: Diagnosis not present

## 2019-12-11 DIAGNOSIS — I10 Essential (primary) hypertension: Secondary | ICD-10-CM | POA: Diagnosis not present

## 2019-12-11 DIAGNOSIS — K449 Diaphragmatic hernia without obstruction or gangrene: Secondary | ICD-10-CM | POA: Diagnosis not present

## 2019-12-11 DIAGNOSIS — Z9012 Acquired absence of left breast and nipple: Secondary | ICD-10-CM | POA: Diagnosis not present

## 2019-12-25 DIAGNOSIS — S42201D Unspecified fracture of upper end of right humerus, subsequent encounter for fracture with routine healing: Secondary | ICD-10-CM | POA: Diagnosis not present

## 2019-12-25 DIAGNOSIS — S42351D Displaced comminuted fracture of shaft of humerus, right arm, subsequent encounter for fracture with routine healing: Secondary | ICD-10-CM | POA: Diagnosis not present

## 2020-01-31 DIAGNOSIS — E7800 Pure hypercholesterolemia, unspecified: Secondary | ICD-10-CM | POA: Diagnosis not present

## 2020-01-31 DIAGNOSIS — I10 Essential (primary) hypertension: Secondary | ICD-10-CM | POA: Diagnosis not present

## 2020-01-31 DIAGNOSIS — F411 Generalized anxiety disorder: Secondary | ICD-10-CM | POA: Diagnosis not present

## 2020-01-31 DIAGNOSIS — S42201A Unspecified fracture of upper end of right humerus, initial encounter for closed fracture: Secondary | ICD-10-CM | POA: Diagnosis not present

## 2020-01-31 DIAGNOSIS — K259 Gastric ulcer, unspecified as acute or chronic, without hemorrhage or perforation: Secondary | ICD-10-CM | POA: Diagnosis not present

## 2020-03-21 DIAGNOSIS — S42201D Unspecified fracture of upper end of right humerus, subsequent encounter for fracture with routine healing: Secondary | ICD-10-CM | POA: Diagnosis not present

## 2020-03-21 DIAGNOSIS — F419 Anxiety disorder, unspecified: Secondary | ICD-10-CM | POA: Diagnosis not present

## 2020-03-21 DIAGNOSIS — E785 Hyperlipidemia, unspecified: Secondary | ICD-10-CM | POA: Diagnosis not present

## 2020-03-21 DIAGNOSIS — T84298A Other mechanical complication of internal fixation device of other bones, initial encounter: Secondary | ICD-10-CM | POA: Diagnosis not present

## 2020-03-21 DIAGNOSIS — R059 Cough, unspecified: Secondary | ICD-10-CM | POA: Diagnosis not present

## 2020-03-21 DIAGNOSIS — T8484XA Pain due to internal orthopedic prosthetic devices, implants and grafts, initial encounter: Secondary | ICD-10-CM | POA: Diagnosis not present

## 2020-03-21 DIAGNOSIS — I10 Essential (primary) hypertension: Secondary | ICD-10-CM | POA: Diagnosis not present

## 2020-04-10 DIAGNOSIS — S42351D Displaced comminuted fracture of shaft of humerus, right arm, subsequent encounter for fracture with routine healing: Secondary | ICD-10-CM | POA: Diagnosis not present

## 2020-04-10 DIAGNOSIS — S42201D Unspecified fracture of upper end of right humerus, subsequent encounter for fracture with routine healing: Secondary | ICD-10-CM | POA: Diagnosis not present

## 2020-04-17 DIAGNOSIS — H4042X3 Glaucoma secondary to eye inflammation, left eye, severe stage: Secondary | ICD-10-CM | POA: Diagnosis not present

## 2020-04-30 DIAGNOSIS — I10 Essential (primary) hypertension: Secondary | ICD-10-CM | POA: Diagnosis not present

## 2020-04-30 DIAGNOSIS — M8008XA Age-related osteoporosis with current pathological fracture, vertebra(e), initial encounter for fracture: Secondary | ICD-10-CM | POA: Diagnosis not present

## 2020-04-30 DIAGNOSIS — G8929 Other chronic pain: Secondary | ICD-10-CM | POA: Diagnosis not present

## 2020-04-30 DIAGNOSIS — D0512 Intraductal carcinoma in situ of left breast: Secondary | ICD-10-CM | POA: Diagnosis not present

## 2020-04-30 DIAGNOSIS — S42201A Unspecified fracture of upper end of right humerus, initial encounter for closed fracture: Secondary | ICD-10-CM | POA: Diagnosis not present

## 2020-04-30 DIAGNOSIS — M545 Low back pain, unspecified: Secondary | ICD-10-CM | POA: Diagnosis not present

## 2020-06-16 DIAGNOSIS — D0512 Intraductal carcinoma in situ of left breast: Secondary | ICD-10-CM | POA: Diagnosis not present

## 2020-06-16 DIAGNOSIS — I10 Essential (primary) hypertension: Secondary | ICD-10-CM | POA: Diagnosis not present

## 2020-06-16 DIAGNOSIS — E7800 Pure hypercholesterolemia, unspecified: Secondary | ICD-10-CM | POA: Diagnosis not present

## 2020-06-16 DIAGNOSIS — F411 Generalized anxiety disorder: Secondary | ICD-10-CM | POA: Diagnosis not present

## 2020-06-16 DIAGNOSIS — Z1231 Encounter for screening mammogram for malignant neoplasm of breast: Secondary | ICD-10-CM | POA: Diagnosis not present

## 2020-08-06 DIAGNOSIS — G8929 Other chronic pain: Secondary | ICD-10-CM | POA: Diagnosis not present

## 2020-08-06 DIAGNOSIS — M545 Low back pain, unspecified: Secondary | ICD-10-CM | POA: Diagnosis not present

## 2020-08-06 DIAGNOSIS — F411 Generalized anxiety disorder: Secondary | ICD-10-CM | POA: Diagnosis not present

## 2020-08-06 DIAGNOSIS — I10 Essential (primary) hypertension: Secondary | ICD-10-CM | POA: Diagnosis not present

## 2020-08-11 DIAGNOSIS — Z853 Personal history of malignant neoplasm of breast: Secondary | ICD-10-CM | POA: Diagnosis not present

## 2020-08-11 DIAGNOSIS — D0512 Intraductal carcinoma in situ of left breast: Secondary | ICD-10-CM | POA: Diagnosis not present

## 2020-08-11 DIAGNOSIS — R922 Inconclusive mammogram: Secondary | ICD-10-CM | POA: Diagnosis not present

## 2020-08-11 DIAGNOSIS — Z08 Encounter for follow-up examination after completed treatment for malignant neoplasm: Secondary | ICD-10-CM | POA: Diagnosis not present

## 2020-11-06 DIAGNOSIS — I10 Essential (primary) hypertension: Secondary | ICD-10-CM | POA: Diagnosis not present

## 2020-11-06 DIAGNOSIS — E038 Other specified hypothyroidism: Secondary | ICD-10-CM | POA: Diagnosis not present

## 2020-11-06 DIAGNOSIS — F411 Generalized anxiety disorder: Secondary | ICD-10-CM | POA: Diagnosis not present

## 2020-11-06 DIAGNOSIS — Z23 Encounter for immunization: Secondary | ICD-10-CM | POA: Diagnosis not present

## 2020-11-12 DIAGNOSIS — H4042X3 Glaucoma secondary to eye inflammation, left eye, severe stage: Secondary | ICD-10-CM | POA: Diagnosis not present

## 2021-01-30 DIAGNOSIS — J069 Acute upper respiratory infection, unspecified: Secondary | ICD-10-CM | POA: Diagnosis not present

## 2021-04-23 DIAGNOSIS — F411 Generalized anxiety disorder: Secondary | ICD-10-CM | POA: Diagnosis not present

## 2021-04-23 DIAGNOSIS — N3281 Overactive bladder: Secondary | ICD-10-CM | POA: Diagnosis not present

## 2021-05-14 DIAGNOSIS — N3281 Overactive bladder: Secondary | ICD-10-CM | POA: Diagnosis not present

## 2021-05-14 DIAGNOSIS — F411 Generalized anxiety disorder: Secondary | ICD-10-CM | POA: Diagnosis not present

## 2021-05-14 DIAGNOSIS — M85852 Other specified disorders of bone density and structure, left thigh: Secondary | ICD-10-CM | POA: Diagnosis not present

## 2021-06-18 DIAGNOSIS — M85852 Other specified disorders of bone density and structure, left thigh: Secondary | ICD-10-CM | POA: Diagnosis not present

## 2021-07-07 DIAGNOSIS — H04123 Dry eye syndrome of bilateral lacrimal glands: Secondary | ICD-10-CM | POA: Diagnosis not present

## 2021-07-07 DIAGNOSIS — H4042X3 Glaucoma secondary to eye inflammation, left eye, severe stage: Secondary | ICD-10-CM | POA: Diagnosis not present

## 2021-08-05 DIAGNOSIS — Z1231 Encounter for screening mammogram for malignant neoplasm of breast: Secondary | ICD-10-CM | POA: Diagnosis not present

## 2021-08-25 DIAGNOSIS — H4042X3 Glaucoma secondary to eye inflammation, left eye, severe stage: Secondary | ICD-10-CM | POA: Diagnosis not present

## 2021-08-25 DIAGNOSIS — H53422 Scotoma of blind spot area, left eye: Secondary | ICD-10-CM | POA: Diagnosis not present

## 2021-08-25 DIAGNOSIS — H35352 Cystoid macular degeneration, left eye: Secondary | ICD-10-CM | POA: Diagnosis not present

## 2021-10-20 DIAGNOSIS — H4042X3 Glaucoma secondary to eye inflammation, left eye, severe stage: Secondary | ICD-10-CM | POA: Diagnosis not present

## 2021-10-20 DIAGNOSIS — H35352 Cystoid macular degeneration, left eye: Secondary | ICD-10-CM | POA: Diagnosis not present

## 2021-11-26 DIAGNOSIS — N3281 Overactive bladder: Secondary | ICD-10-CM | POA: Diagnosis not present

## 2021-11-26 DIAGNOSIS — F411 Generalized anxiety disorder: Secondary | ICD-10-CM | POA: Diagnosis not present

## 2021-11-26 DIAGNOSIS — N644 Mastodynia: Secondary | ICD-10-CM | POA: Diagnosis not present

## 2021-11-26 DIAGNOSIS — R251 Tremor, unspecified: Secondary | ICD-10-CM | POA: Diagnosis not present

## 2021-11-26 DIAGNOSIS — Z23 Encounter for immunization: Secondary | ICD-10-CM | POA: Diagnosis not present
